# Patient Record
Sex: Male | Born: 1965 | Race: White | Hispanic: No | Marital: Married | Smoking: Never smoker
Health system: Southern US, Community
[De-identification: ages and names within clinical notes are randomized; demographics above are authoritative.]

## PROBLEM LIST (undated history)

## (undated) DIAGNOSIS — D72819 Decreased white blood cell count, unspecified: Secondary | ICD-10-CM

## (undated) DIAGNOSIS — K769 Liver disease, unspecified: Secondary | ICD-10-CM

## (undated) DIAGNOSIS — K219 Gastro-esophageal reflux disease without esophagitis: Secondary | ICD-10-CM

## (undated) DIAGNOSIS — E785 Hyperlipidemia, unspecified: Secondary | ICD-10-CM

## (undated) DIAGNOSIS — E291 Testicular hypofunction: Secondary | ICD-10-CM

## (undated) DIAGNOSIS — R911 Solitary pulmonary nodule: Secondary | ICD-10-CM

---

## 2006-04-26 ENCOUNTER — Ambulatory Visit (HOSPITAL_COMMUNITY): Admission: RE | Admit: 2006-04-26 | Discharge: 2006-04-26 | Payer: Self-pay | Admitting: *Deleted

## 2006-04-26 ENCOUNTER — Encounter (INDEPENDENT_AMBULATORY_CARE_PROVIDER_SITE_OTHER): Payer: Self-pay | Admitting: Specialist

## 2006-07-17 HISTORY — PX: UPPER GI ENDOSCOPY: SHX6162

## 2006-09-27 ENCOUNTER — Encounter: Admission: RE | Admit: 2006-09-27 | Discharge: 2006-09-27 | Payer: Self-pay | Admitting: Internal Medicine

## 2007-09-24 ENCOUNTER — Encounter: Admission: RE | Admit: 2007-09-24 | Discharge: 2007-09-24 | Payer: Self-pay | Admitting: Internal Medicine

## 2010-08-02 ENCOUNTER — Ambulatory Visit
Admission: RE | Admit: 2010-08-02 | Discharge: 2010-08-02 | Payer: Self-pay | Source: Home / Self Care | Attending: Sports Medicine | Admitting: Sports Medicine

## 2010-08-02 DIAGNOSIS — M546 Pain in thoracic spine: Secondary | ICD-10-CM | POA: Insufficient documentation

## 2010-08-02 DIAGNOSIS — M4120 Other idiopathic scoliosis, site unspecified: Secondary | ICD-10-CM | POA: Insufficient documentation

## 2010-08-03 ENCOUNTER — Encounter: Payer: Self-pay | Admitting: Sports Medicine

## 2010-08-18 NOTE — Letter (Signed)
Summary: Generic Letter  [facility]  [address]   [place], [place] [address]   Phone: [phone]  Fax: [phone]    08/03/2010 Merri Brunette, MD [facility] Fax: [phone]   Dear Dr. Renne Crigler:  I had the pleasure of evaluating your patient:  Ian [name] [address] [place], [place]  [address]  His mid thoracic back pain is associated with mild scoliosis and changes of vertebral body height at 2 levels.  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  If he continues to do a good program of strengthening and postural exercises he should be able to control symptoms from this but is at higher risk of progrssion of DDD and DJD.      Sincerely,  Vincent Gros MD

## 2010-08-18 NOTE — Assessment & Plan Note (Signed)
Summary: SCAPULA PAIN/NP/LP   Vital Signs:  Patient profile:   45 year old male Height:      71 inches Weight:      180 pounds BMI:     25.20 BP sitting:   136 / 90  Vitals Entered By: Lillia Pauls CMA (August 02, 2010 10:58 AM) CC: Back pain   CC:  Back pain.  History of Present Illness: Pain in the thoracic back for the past 6-9 months.  He has been seen by his PCP, Ortho and PT. He was diagnnosed with winged scapula and given a PT treatment plan. Since his PT program pain has improved but is still persistant. He works to improve his posture and back strength exercises.  His symptoms are middle thoracic back pain and soreness. He denies any redicular symptoms to his hands or legs. He deneis any fever, chills or night soreness.  For exercise he uses an eliptical and plays tennis about once a month.   Preventive Screening-Counseling & Management  Alcohol-Tobacco     Smoking Status: never  Current Problems (verified): 1)  Back Pain, Thoracic Region  (ICD-724.1)  Current Medications (verified): 1)  None  Allergies (verified): No Known Drug Allergies  Social History: Smoking Status:  never  Review of Systems  The patient denies anorexia, fever, and weight loss.    Physical Exam  General:  Well nad Msk:  Back: Normal appearance with good bulk of the paraspinus muscle groups and scapular muscles.    Shoulder: Normal appearance and ROM and scapular function.   Normal straight leg raise  Hips: Normal ROM and strength.  Additional Exam:  X-ray from PCP:  PA view: Mild mid thoracic scholiosis Lat View: Excessive kyphosis curvature of the thoracic back with decreased vetebral body height in curve. 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 We do not see evidenc for large bone spurs or severe bony disease.  We asked Mr Prestia to continue his home PT exercises as well as adding lawnmower, and robber exercises. We also asked him to do a reset stretch with a foam cilinder.  He will follow up as needed.   Problem # 2:  SCOLIOSIS, THORACIC SPINE (ICD-737.30) He will need to continually work with posture to keep this from worsening D/W him the need to watch sitting positoin on computer, etc  Patient Instructions: 1)  Thank you for seeing me today. 2)  Please continue your PT exercises.  3)  Add lawnmower and robbery exercises with a 5 lb weight. Goals are 3 sets of 15. You can go up to 10 lbs if you can do 5lbs with no problems.  4)  Lay on a foam cilinder (noodle) and allow your shoudlers to relax. 1-2x daily. This is the reset stretch.  5)  Let pain be your guide. Do not continue exercises that cause more than a 3/10 for pain.  6)  Posture is important at home and at work.  7)  Come back if you worsen or do not improve.  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

## 2010-12-02 NOTE — Op Note (Signed)
NAME:  Ian [name], Ian [name]             ACCOUNT NO.:  [id number]   MEDICAL RECORD NO.:  [id number]          PATIENT TYPE:  AMB   LOCATION:  ENDO                         FACILITY:  [facility]   PHYSICIAN:  Georgiana Spinner, M.D.    DATE OF BIRTH:  03-24-1966   DATE OF PROCEDURE:  04/26/2006  DATE OF DISCHARGE:                                 OPERATIVE REPORT   PROCEDURE:  Upper endoscopy with dilation and biopsy.   INDICATIONS:  Dysphagia.   ANESTHESIA:  Demerol 70 mg, Versed 8 mg.   PROCEDURE:  With the patient mildly sedated in left lateral decubitus  position the Olympus videoscopic endoscope was inserted in the mouth and  passed under vision through the esophagus which appeared normal until we  reached the distal esophagus and there was change of esophagitis  photographed. We entered the stomach. Fundus, body, antrum, duodenal bulb,  second portion duodenum were visualized. From this point the endoscope was  slowly withdrawn taking circumferential views of the duodenal mucosa until  the endoscope had been pulled back into the stomach and placed in  retroflexion to view the stomach from below. The endoscope was then  straightened and a guidewire was passed. The endoscope was withdrawn and  under fluoroscopic guidance a 17 and then 18 Savary dilators passed with  minimal resistance.  There was slight amount of heme on second dilation as  we withdrew it along with the guidewire. The endoscope was reinserted into  the stomach and biopsies of what appeared to be an area of gastritis in the  body of the stomach were noted and biopsied. The endoscope was then  withdrawn.  The patient's vital signs, pulse oximeter remained stable.  The  patient tolerated procedure well without complication.   FINDINGS:  Changes of esophagitis distally and gastritis involving the  stomach.  Await biopsy report, clinical response to dilation. The patient  will call me for results and follow-up with me as an  outpatient.           ______________________________  Georgiana Spinner, M.D.     GMO/MEDQ  D:  04/26/2006  T:  04/27/2006  Job:  [id number]

## 2012-08-20 ENCOUNTER — Other Ambulatory Visit: Payer: Self-pay | Admitting: Registered Nurse

## 2016-10-06 DIAGNOSIS — Z125 Encounter for screening for malignant neoplasm of prostate: Secondary | ICD-10-CM | POA: Diagnosis not present

## 2016-10-06 DIAGNOSIS — Z0000 Encounter for general adult medical examination without abnormal findings: Secondary | ICD-10-CM | POA: Diagnosis not present

## 2016-10-10 DIAGNOSIS — D72819 Decreased white blood cell count, unspecified: Secondary | ICD-10-CM | POA: Diagnosis not present

## 2016-10-10 DIAGNOSIS — Z0000 Encounter for general adult medical examination without abnormal findings: Secondary | ICD-10-CM | POA: Diagnosis not present

## 2016-10-10 DIAGNOSIS — Z1212 Encounter for screening for malignant neoplasm of rectum: Secondary | ICD-10-CM | POA: Diagnosis not present

## 2016-10-10 DIAGNOSIS — E782 Mixed hyperlipidemia: Secondary | ICD-10-CM | POA: Diagnosis not present

## 2016-10-11 DIAGNOSIS — D72819 Decreased white blood cell count, unspecified: Secondary | ICD-10-CM | POA: Diagnosis not present

## 2017-10-15 DIAGNOSIS — Z0000 Encounter for general adult medical examination without abnormal findings: Secondary | ICD-10-CM | POA: Diagnosis not present

## 2017-10-15 DIAGNOSIS — D72819 Decreased white blood cell count, unspecified: Secondary | ICD-10-CM | POA: Diagnosis not present

## 2017-10-19 DIAGNOSIS — Z0000 Encounter for general adult medical examination without abnormal findings: Secondary | ICD-10-CM | POA: Diagnosis not present

## 2017-10-19 DIAGNOSIS — E782 Mixed hyperlipidemia: Secondary | ICD-10-CM | POA: Diagnosis not present

## 2018-04-16 DIAGNOSIS — Z1212 Encounter for screening for malignant neoplasm of rectum: Secondary | ICD-10-CM | POA: Diagnosis not present

## 2018-04-16 DIAGNOSIS — Z1211 Encounter for screening for malignant neoplasm of colon: Secondary | ICD-10-CM | POA: Diagnosis not present

## 2018-09-10 DIAGNOSIS — J019 Acute sinusitis, unspecified: Secondary | ICD-10-CM | POA: Diagnosis not present

## 2018-11-04 DIAGNOSIS — Z125 Encounter for screening for malignant neoplasm of prostate: Secondary | ICD-10-CM | POA: Diagnosis not present

## 2018-11-04 DIAGNOSIS — Z1159 Encounter for screening for other viral diseases: Secondary | ICD-10-CM | POA: Diagnosis not present

## 2018-11-04 DIAGNOSIS — Z0000 Encounter for general adult medical examination without abnormal findings: Secondary | ICD-10-CM | POA: Diagnosis not present

## 2018-11-07 DIAGNOSIS — E782 Mixed hyperlipidemia: Secondary | ICD-10-CM | POA: Diagnosis not present

## 2018-11-07 DIAGNOSIS — Z1212 Encounter for screening for malignant neoplasm of rectum: Secondary | ICD-10-CM | POA: Diagnosis not present

## 2018-11-07 DIAGNOSIS — Z23 Encounter for immunization: Secondary | ICD-10-CM | POA: Diagnosis not present

## 2018-11-07 DIAGNOSIS — K219 Gastro-esophageal reflux disease without esophagitis: Secondary | ICD-10-CM | POA: Diagnosis not present

## 2018-11-07 DIAGNOSIS — J309 Allergic rhinitis, unspecified: Secondary | ICD-10-CM | POA: Diagnosis not present

## 2018-11-07 DIAGNOSIS — Z0000 Encounter for general adult medical examination without abnormal findings: Secondary | ICD-10-CM | POA: Diagnosis not present

## 2020-11-26 ENCOUNTER — Other Ambulatory Visit: Payer: Self-pay | Admitting: Internal Medicine

## 2020-11-26 DIAGNOSIS — E782 Mixed hyperlipidemia: Secondary | ICD-10-CM

## 2020-12-29 ENCOUNTER — Ambulatory Visit
Admission: RE | Admit: 2020-12-29 | Discharge: 2020-12-29 | Disposition: A | Discharge status: Home / Self Care | Payer: No Typology Code available for payment source | Source: Ambulatory Visit | Attending: Internal Medicine | Admitting: Internal Medicine

## 2020-12-29 DIAGNOSIS — E782 Mixed hyperlipidemia: Secondary | ICD-10-CM

## 2021-05-10 LAB — COLOGUARD: COLOGUARD: NEGATIVE

## 2021-06-06 ENCOUNTER — Other Ambulatory Visit: Payer: Self-pay | Admitting: Internal Medicine

## 2021-06-06 DIAGNOSIS — R918 Other nonspecific abnormal finding of lung field: Secondary | ICD-10-CM

## 2021-07-22 ENCOUNTER — Other Ambulatory Visit: Payer: No Typology Code available for payment source

## 2021-08-15 ENCOUNTER — Ambulatory Visit
Admission: RE | Admit: 2021-08-15 | Discharge: 2021-08-15 | Disposition: A | Discharge status: Home / Self Care | Payer: 59 | Source: Ambulatory Visit | Attending: Internal Medicine | Admitting: Internal Medicine

## 2021-08-15 ENCOUNTER — Other Ambulatory Visit: Payer: No Typology Code available for payment source

## 2021-08-15 ENCOUNTER — Other Ambulatory Visit: Payer: Self-pay

## 2021-08-15 DIAGNOSIS — R918 Other nonspecific abnormal finding of lung field: Secondary | ICD-10-CM

## 2021-08-30 ENCOUNTER — Other Ambulatory Visit: Payer: Self-pay | Admitting: Internal Medicine

## 2021-08-30 DIAGNOSIS — K769 Liver disease, unspecified: Secondary | ICD-10-CM

## 2021-09-24 ENCOUNTER — Ambulatory Visit
Admission: RE | Admit: 2021-09-24 | Discharge: 2021-09-24 | Disposition: A | Discharge status: Home / Self Care | Payer: 59 | Source: Ambulatory Visit | Attending: Internal Medicine | Admitting: Internal Medicine

## 2021-09-24 ENCOUNTER — Other Ambulatory Visit: Payer: Self-pay

## 2021-09-24 DIAGNOSIS — K769 Liver disease, unspecified: Secondary | ICD-10-CM

## 2021-09-24 MED ORDER — GADOBENATE DIMEGLUMINE 529 MG/ML IV SOLN
17.0000 mL | Freq: Once | INTRAVENOUS | Status: AC | PRN
  Administered 2021-09-24: 17 mL via INTRAVENOUS

## 2021-09-29 ENCOUNTER — Telehealth: Payer: Self-pay | Admitting: Oncology

## 2021-09-29 NOTE — Telephone Encounter (Signed)
[facility] Scheduling Phone Note ? ?I contacted Ian [name] regarding a referral from Olena Heckle with [facility] for purpose of evaluation and work up of left kidney mass.  Ian [name] was aware of his referral and reason.  Information on reason for referral was not necessary. ? ?Patient was informed about the [facility] and the intent being to complete a diagnostic/prognostic work-up for his suspicious findings.  He is aware his appointment is with our Physician Assistant to identify a diagnostic plan of care and to arrange further oncologist or specialist care as indicated. ? ?I confirmed with Ian [name] he  has transportation to his [facility] appointment.  Patient is aware to bring a list of medications, as well as insurance cards.  Visitor policy and COVID protocol reviewed with patient as well, including what to expect on arrival to the [facility] regarding check-in process, visit, and potential for labs afterwards. ? ?We look forward to meeting Ian [name] and completing his diagnostic work-up and arranging his subsequent appointment with our oncologist team. ? ?Diagnostic Clinic Specific Info: ? ?Best contact/way to contact patient:  mobile ?CHL updated to reflect?:  not applicable ?Is there a HC POA?:  No ? ?Location of Diagnostic Clinic Appointment and Contact Info Provided to Patient: ? ?[facility] ?[address] ?[place], [place] [address] ?[phone] ? ? ?Reason for Referral, Clinical Information: ? ?09/24/2021 MR ABDOMEN W WO CONTRAST ?IMPRESSION: ?1. There is a heterogeneously enhancing mass of the posterior lip of ?the midportion of the left kidney measuring 3.5 x 3.0 cm. There ?appears to be at least some subtle involvement of the adjacent left ?renal vein. Findings are highly concerning for renal cell carcinoma. ?2. No evidence of lymphadenopathy or metastatic disease in  the ?abdomen. ?3. Definitively benign hemangiomata of the left and right lobes of ?the liver. ? ?08/15/2021 CT CHEST WO CONTRAST ?IMPRESSION: ?1. Stable size and appearance of 3-4 mm sub solid nodular density in ?the perifissural right middle lobe. The previous perifissural left ?lower lobe nodule on prior exam has diminished, likely an ?intrapulmonary lymph node. This exam constitutes 6 months imaging ?stability. Recommended follow-up for a stable sub solid nodule of ?this size is as follows: Consider additional non-contrast chest CT ?examinations at 2 and 4 years. This recommendation follows the ?consensus statement: Guidelines for Management of Incidental ?Pulmonary Nodules Detected on CT Images: From the Fleischner Society ?2017; Radiology 2017; [id number]. ?2. Tiny 2 mm subpleural nodule in the left lung apex, not previously ?included in the field of view. This can be followed on subsequent ?exams. ?3. Indeterminate 3.4 cm low-density liver lesion in the lower right ?hepatic lobe. No prior abdominal imaging to establish stability. ?Recommend further characterization with hepatic protocol MRI.  ?

## 2021-10-12 ENCOUNTER — Other Ambulatory Visit: Payer: Self-pay

## 2021-10-12 ENCOUNTER — Inpatient Hospital Stay: Payer: 59 | Attending: Physician Assistant | Admitting: Physician Assistant

## 2021-10-12 ENCOUNTER — Encounter: Payer: Self-pay | Admitting: Physician Assistant

## 2021-10-12 ENCOUNTER — Inpatient Hospital Stay: Payer: 59

## 2021-10-12 VITALS — BP 129/86 | HR 68 | Temp 97.7°F | Resp 20 | Wt 191.4 lb

## 2021-10-12 DIAGNOSIS — N2889 Other specified disorders of kidney and ureter: Secondary | ICD-10-CM | POA: Diagnosis present

## 2021-10-12 DIAGNOSIS — R918 Other nonspecific abnormal finding of lung field: Secondary | ICD-10-CM | POA: Insufficient documentation

## 2021-10-12 LAB — CBC WITH DIFFERENTIAL (CANCER CENTER ONLY)
Abs Immature Granulocytes: 0 10*3/uL (ref 0.00–0.07)
Basophils Absolute: 0.1 10*3/uL (ref 0.0–0.1)
Basophils Relative: 1 %
Eosinophils Absolute: 0.1 10*3/uL (ref 0.0–0.5)
Eosinophils Relative: 3 %
HCT: 36.3 % — ABNORMAL LOW (ref 39.0–52.0)
Hemoglobin: 12.4 g/dL — ABNORMAL LOW (ref 13.0–17.0)
Immature Granulocytes: 0 %
Lymphocytes Relative: 42 %
Lymphs Abs: 2 10*3/uL (ref 0.7–4.0)
MCH: 33.2 pg (ref 26.0–34.0)
MCHC: 34.2 g/dL (ref 30.0–36.0)
MCV: 97.1 fL (ref 80.0–100.0)
Monocytes Absolute: 0.6 10*3/uL (ref 0.1–1.0)
Monocytes Relative: 12 %
Neutro Abs: 2 10*3/uL (ref 1.7–7.7)
Neutrophils Relative %: 42 %
Platelet Count: 247 10*3/uL (ref 150–400)
RBC: 3.74 MIL/uL — ABNORMAL LOW (ref 4.22–5.81)
RDW: 11.7 % (ref 11.5–15.5)
WBC Count: 4.8 10*3/uL (ref 4.0–10.5)
nRBC: 0 % (ref 0.0–0.2)

## 2021-10-12 LAB — CMP (CANCER CENTER ONLY)
ALT: 25 U/L (ref 0–44)
AST: 23 U/L (ref 15–41)
Albumin: 4.4 g/dL (ref 3.5–5.0)
Alkaline Phosphatase: 74 U/L (ref 38–126)
Anion gap: 6 (ref 5–15)
BUN: 21 mg/dL — ABNORMAL HIGH (ref 6–20)
CO2: 30 mmol/L (ref 22–32)
Calcium: 9.6 mg/dL (ref 8.9–10.3)
Chloride: 103 mmol/L (ref 98–111)
Creatinine: 0.9 mg/dL (ref 0.61–1.24)
GFR, Estimated: >60 mL/min (ref >60)
Glucose, Bld: 82 mg/dL (ref 70–99)
Potassium: 4.2 mmol/L (ref 3.5–5.1)
Sodium: 139 mmol/L (ref 135–145)
Total Bilirubin: 0.7 mg/dL (ref 0.3–1.2)
Total Protein: 7.5 g/dL (ref 6.5–8.1)

## 2021-10-12 NOTE — Progress Notes (Signed)
?[facility] ?[place] ?[phone]   [phone] ? ?INITIAL CONSULTATION: ? ?Patient Care Team: ?Deland Pretty, MD as PCP - General (Internal Medicine) ? ?CHIEF COMPLAINTS/PURPOSE OF CONSULTATION:  ?Left renal mass, subcentimeter lung nodules ? ?HISTORY OF PRESENTING ILLNESS:  ?Ian [name] 56 y.o. male without any medical history presents to the diagnostic clinic for evaluation for left renal mass and subcentimeter lung nodules. ? ?On review of the previous records, Ian [name] underwent routine CT cardiac calcium screening on 12/29/2020.  Findings revealed subtle, small nodular densities in the lungs that were indeterminate.  Repeat CT chest was obtained on 08/15/2021 that showed stable to improving size and appearance of the subcentimeter lung nodules.  There was an incidental finding of an indeterminate 3.4 cm low-density liver lesion in the lower right hepatic lobe.  An MRI of the abdomen was obtained on 09/24/2021 that showed benign fluid-filled hemangiomas involving left and right lobes of the liver.  Additionally there was evidence of heterogeneously enhancing mass of left kidney measuring 3.5 x 3.0 cm with some subtle involvement of the adjacent left renal vein.  No evidence of lymphadenopathy or metastatic disease in the abdomen. ? ?At today's visit, Ian [name] reports having stable energy levels and able to complete all his daily activities on his own. He denies any weight changes or appetite loss. He denies any abdominal pain or back pain. He denies any urinary symptoms including hematuria or dysuria. Patient denies fevers, chills, night sweats, shortness of breath, chest, nausea, vomiting, diarrhea. He has no other complaints. Rest of the 10 point ROS is below.  ? ?MEDICAL HISTORY:  ?History reviewed. No pertinent past medical history. ? ?SURGICAL HISTORY: ?History reviewed. No pertinent surgical history. ? ?SOCIAL HISTORY: ?Social History   ? ?Socioeconomic History  ? Marital status: Married  ?  Spouse name: Not on file  ? Number of children: Not on file  ? Years of education: Not on file  ? Highest education level: Not on file  ?Occupational History  ? Not on file  ?Tobacco Use  ? Smoking status: Never  ? Smokeless tobacco: Never  ?Substance and Sexual Activity  ? Alcohol use: Yes  ?  Alcohol/week: 14.0 standard drinks  ?  Types: 14 Standard drinks or equivalent per week  ? Drug use: Never  ? Sexual activity: Not on file  ?Other Topics Concern  ? Not on file  ?Social History Narrative  ? Not on file  ? ?Social Determinants of Health  ? ?Financial Resource Strain: Not on file  ?Food Insecurity: Not on file  ?Transportation Needs: Not on file  ?Physical Activity: Not on file  ?Stress: Not on file  ?Social Connections: Not on file  ?Intimate Partner Violence: Not on file  ? ? ?FAMILY HISTORY: ?Family History  ?Problem Relation Age of Onset  ? Melanoma Mother   ? Non-Hodgkin's lymphoma Father   ? ? ?ALLERGIES:  has no allergies on file. ? ?MEDICATIONS:  ?Current Outpatient Medications  ?Medication Sig Dispense Refill  ? Cyanocobalamin (B-12 SL) Place under the tongue.    ? fexofenadine (ALLEGRA) 60 MG tablet Take 60 mg by mouth as needed for allergies or rhinitis.    ? glucosamine-chondroitin 500-400 MG tablet Take 2 tablets by mouth daily.    ? Multiple Vitamins-Minerals (MENS MULTIVITAMIN PO) Take by mouth.    ? Omega-3 Fatty Acids (FISH OIL PO) Take by mouth daily.    ? omeprazole (PRILOSEC) 20 MG capsule Take 20 mg by  mouth daily.    ? tadalafil (CIALIS) 20 MG tablet Take 20 mg by mouth daily as needed for erectile dysfunction.    ? ?No current facility-administered medications for this visit.  ? ? ?REVIEW OF SYSTEMS:   ?Constitutional: ( - ) fevers, ( - )  chills , ( - ) night sweats ?Eyes: ( - ) blurriness of vision, ( - ) double vision, ( - ) watery eyes ?Ears, nose, mouth, throat, and face: ( - ) mucositis, ( - ) sore throat ?Respiratory: ( - )  cough, ( - ) dyspnea, ( - ) wheezes ?Cardiovascular: ( - ) palpitation, ( - ) chest discomfort, ( - ) lower extremity swelling ?Gastrointestinal:  ( - ) nausea, ( - ) heartburn, ( - ) change in bowel habits ?Skin: ( - ) abnormal skin rashes ?Lymphatics: ( - ) new lymphadenopathy, ( - ) easy bruising ?Neurological: ( - ) numbness, ( - ) tingling, ( - ) new weaknesses ?Behavioral/Psych: ( - ) mood change, ( - ) new changes  ?All other systems were reviewed with the patient and are negative. ? ?PHYSICAL EXAMINATION: ?ECOG PERFORMANCE STATUS: 0 - Asymptomatic ? ?Vitals:  ? 10/12/21 1308  ?BP: 129/86  ?Pulse: 68  ?Resp: 20  ?Temp: 97.7 ?F (36.5 ?C)  ?SpO2: 100%  ? ?Filed Weights  ? 10/12/21 1308  ?Weight: 191 lb 6.4 oz (86.8 kg)  ? ? ?GENERAL: well appearing male in NAD  ?SKIN: skin color, texture, turgor are normal, no rashes or significant lesions ?EYES: conjunctiva are pink and non-injected, sclera clear ?OROPHARYNX: no exudate, no erythema; lips, buccal mucosa, and tongue normal  ?NECK: supple, non-tender ?LYMPH:  no palpable lymphadenopathy in the cervical, axillary or supraclavicular lymph nodes.  ?LUNGS: clear to auscultation and percussion with normal breathing effort ?HEART: regular rate & rhythm and no murmurs and no lower extremity edema ?ABDOMEN: soft, non-tender, non-distended, normal bowel sounds ?Musculoskeletal: no cyanosis of digits and no clubbing  ?PSYCH: alert & oriented x 3, fluent speech ?NEURO: no focal motor/sensory deficits ? ?LABORATORY DATA:  ?I have reviewed the data as listed ? ?  Latest Ref Rng & Units 10/12/2021  ?  2:22 PM  ?CBC  ?WBC 4.0 - 10.5 K/uL 4.8    ?Hemoglobin 13.0 - 17.0 g/dL 12.4    ?Hematocrit 39.0 - 52.0 % 36.3    ?Platelets 150 - 400 K/uL 247    ? ? ? ?  Latest Ref Rng & Units 10/12/2021  ?  2:22 PM  ?CMP  ?Glucose 70 - 99 mg/dL 82    ?BUN 6 - 20 mg/dL 21    ?Creatinine 0.61 - 1.24 mg/dL 0.90    ?Sodium 135 - 145 mmol/L 139    ?Potassium 3.5 - 5.1 mmol/L 4.2    ?Chloride 98 -  111 mmol/L 103    ?CO2 22 - 32 mmol/L 30    ?Calcium 8.9 - 10.3 mg/dL 9.6    ?Total Protein 6.5 - 8.1 g/dL 7.5    ?Total Bilirubin 0.3 - 1.2 mg/dL 0.7    ?Alkaline Phos 38 - 126 U/L 74    ?AST 15 - 41 U/L 23    ?ALT 0 - 44 U/L 25    ? ? ? ?RADIOGRAPHIC STUDIES: ?I have personally reviewed the radiological images as listed and agreed with the findings in the report. ?MR ABDOMEN WWO CONTRAST ? ?Result Date: 09/24/2021 ?CLINICAL DATA:  Characterize liver lesion incidentally identified by CT EXAM: MRI ABDOMEN WITHOUT AND  WITH CONTRAST TECHNIQUE: Multiplanar multisequence MR imaging of the abdomen was performed both before and after the administration of intravenous contrast. CONTRAST:  90m MULTIHANCE GADOBENATE DIMEGLUMINE 529 MG/ML IV SOLN COMPARISON:  CT chest, 08/15/2021 FINDINGS: Lower chest: No acute findings. Hepatobiliary: Definitively benign, fluid signal hemangiomata of the left and right lobes of the liver, demonstrating discontinuous progressive peripheral contrast enhancement, in the posterior left lobe of the liver, hepatic segment II, measuring 3.8 x 3.5 cm (series 15, image 24), in the right lobe of the liver, hepatic segment V, measuring 3.3 x 2.6 cm (series 15, image 44). Additional simple subcentimeter cyst of the posterior liver dome (series 4, image 8). No solid mass or other parenchymal abnormality identified. No gallstones. No biliary ductal dilatation. Pancreas: No mass, inflammatory changes, or other parenchymal abnormality identified.No pancreatic ductal dilatation. Spleen:  Within normal limits in size and appearance. Adrenals/Urinary Tract: Normal adrenal glands. There is a heterogeneously enhancing mass of the posterior lip of the midportion of the left kidney measuring 3.5 x 3.0 cm (series 11, image 62). There appears to be at least some subtle involvement of the adjacent left renal vein (series 13, image 60, series 17, image 33). No evidence of hydronephrosis. Stomach/Bowel: Visualized  portions within the abdomen are unremarkable. Vascular/Lymphatic: No pathologically enlarged lymph nodes identified. No abdominal aortic aneurysm demonstrated. Other:  None. Musculoskeletal: No suspicious osseous lesio

## 2021-10-13 DIAGNOSIS — R918 Other nonspecific abnormal finding of lung field: Secondary | ICD-10-CM | POA: Insufficient documentation

## 2021-10-13 DIAGNOSIS — N2889 Other specified disorders of kidney and ureter: Secondary | ICD-10-CM | POA: Insufficient documentation

## 2021-10-19 ENCOUNTER — Telehealth: Payer: Self-pay

## 2021-10-19 NOTE — Telephone Encounter (Signed)
Mr Capizzi has been referred to [facility].  His appt is 10/25/21 at 8:15.  Pt has been advised of date and time. ?

## 2021-11-01 ENCOUNTER — Other Ambulatory Visit: Payer: Self-pay | Admitting: Urology

## 2021-11-02 ENCOUNTER — Ambulatory Visit: Payer: 59 | Admitting: Physician Assistant

## 2021-11-07 ENCOUNTER — Other Ambulatory Visit: Payer: Self-pay | Admitting: Urology

## 2021-11-07 DIAGNOSIS — D49512 Neoplasm of unspecified behavior of left kidney: Secondary | ICD-10-CM

## 2021-11-24 ENCOUNTER — Ambulatory Visit
Admission: RE | Admit: 2021-11-24 | Discharge: 2021-11-24 | Disposition: A | Discharge status: Home / Self Care | Payer: 59 | Source: Ambulatory Visit | Attending: Urology | Admitting: Urology

## 2021-11-24 DIAGNOSIS — D49512 Neoplasm of unspecified behavior of left kidney: Secondary | ICD-10-CM

## 2021-11-24 MED ORDER — GADOBENATE DIMEGLUMINE 529 MG/ML IV SOLN
17.0000 mL | Freq: Once | INTRAVENOUS | Status: AC | PRN
  Administered 2021-11-24: 17 mL via INTRAVENOUS

## 2021-11-28 ENCOUNTER — Encounter (HOSPITAL_COMMUNITY): Payer: Self-pay

## 2021-11-28 NOTE — Patient Instructions (Signed)
DUE TO COVID-19 ONLY TWO VISITORS  (aged 54 and older)  ARE ALLOWED TO COME WITH YOU AND STAY IN THE WAITING ROOM ONLY DURING PRE OP AND PROCEDURE.  ?  ?**NO VISITORS ARE ALLOWED IN THE SHORT STAY AREA OR RECOVERY ROOM!!** ? ?IF YOU WILL BE ADMITTED INTO THE HOSPITAL YOU ARE ALLOWED ONLY FOUR SUPPORT PEOPLE DURING VISITATION HOURS ONLY ? (7 AM -8PM)   ?The support person(s) must pass our screening, gel in and out, and wear a mask at all times, including in the patient?s room. ?Patients must also wear a mask when staff or their support person are in the room. ?Visitors GUEST BADGE MUST BE WORN VISIBLY  ?One adult visitor may remain with you overnight and MUST be in the room by 8 P.M. ?  ? ? Your procedure is scheduled on: 12/16/21 ? ? Report to [facility] Main Entrance ? ?  Report to admitting at 9:15 AM ? ? Call this number if you have problems the morning of surgery [phone] ? ? Do not eat food :After Midnight.on 12/15/21. Change to a clear liquid diet until midnight  day of surgery then Nothing by mouth until surgery ? ? ?Water ?Black Coffee (sugar ok, NO MILK/CREAM OR CREAMERS)  ?Tea (sugar ok, NO MILK/CREAM OR CREAMERS) regular and decaf                             ?Plain Jell-O (NO RED)                                           ?Fruit ices (not with fruit pulp, NO RED)                                     ?Popsicles (NO RED)                                                                  ?Juice: apple, WHITE grape, WHITE cranberry ?Sports drinks like Gatorade (NO RED) ?Clear broth(vegetable,chicken,beef) ? ?             ? ?              If you have questions, please contact your surgeon?s office. ? ? ?FOLLOW BOWEL PREP AND ANY ADDITIONAL PRE OP INSTRUCTIONS YOU RECEIVED FROM YOUR SURGEON'S OFFICE!!! ?  ?  ?Oral Hygiene is also important to reduce your risk of infection.                                    ?Remember - BRUSH YOUR TEETH THE MORNING OF SURGERY WITH YOUR REGULAR TOOTHPASTE ? ? Do NOT smoke  after Midnight ? ? Take these medicines the morning of surgery with A SIP OF WATER: Omeprazole ? ?DO NOT TAKE ANY ORAL DIABETIC MEDICATIONS DAY OF YOUR SURGERY ? ?Bring CPAP mask and tubing day of surgery. ?                  ?  You may not have any metal on your body including  jewelry, and body piercing ? ?           Do not wear  lotions, powders, cologne, or deodorant ? ? ?            Men may shave face and neck. ? ? Do not bring valuables to the hospital. [facility] NOT ?            RESPONSIBLE   FOR VALUABLES. ? ? Contacts, dentures or bridgework may not be worn into surgery. ? ? Bring small overnight bag day of surgery. ?  ? ? Special Instructions: Bring a copy of your healthcare power of attorney and living will documents  the day of surgery if you haven't scanned them before. ? ?            Please read over the following fact sheets you were given: IF [place] [phone] ? ?   [facility] - Preparing for Surgery ?Before surgery, you can play an important role.  Because skin is not sterile, your skin needs to be as free of germs as possible.  You can reduce the number of germs on your skin by washing with CHG (chlorahexidine gluconate) soap before surgery.  CHG is an antiseptic cleaner which kills germs and bonds with the skin to continue killing germs even after washing. ?Please DO NOT use if you have an allergy to CHG or antibacterial soaps.  If your skin becomes reddened/irritated stop using the CHG and inform your nurse when you arrive at Short Stay. You may shave your face/neck. ?Please follow these instructions carefully: ? 1.  Shower with CHG Soap the night before surgery and the  morning of Surgery. ? 2.  If you choose to wash your hair, wash your hair first as usual with your  normal  shampoo. ? 3.  After you shampoo, rinse your hair and body thoroughly to remove the  shampoo.                       ?     4.  Use CHG as you would any  other liquid soap.  You can apply chg directly  to the skin and wash  ?                     Gently with a scrungie or clean washcloth. ? 5.  Apply the CHG Soap to your body ONLY FROM THE NECK DOWN.   Do not use on face/ open      ?                     Wound or open sores. Avoid contact with eyes, ears mouth and genitals (private parts).  ?                     Production manager,  Genitals (private parts) with your normal soap. ?            6.  Wash thoroughly, paying special attention to the area where your surgery  will be performed. ? 7.  Thoroughly rinse your body with warm water from the neck down. ? 8.  DO NOT shower/wash with your normal soap after using and rinsing off  the CHG Soap. ?               9.  Pat yourself dry with a clean towel. ?           10.  Wear clean pajamas. ?           11.  Place clean sheets on your bed the night of your first shower and do not  sleep with pets. ?Day of Surgery : ?Do not apply any lotions/deodorants the morning of surgery.  Please wear clean clothes to the hospital/surgery center. ? ?FAILURE TO FOLLOW THESE INSTRUCTIONS MAY RESULT IN THE CANCELLATION OF YOUR SURGERY ? ? ? ?________________________________________________________________________  ? ?Incentive Spirometer ? ?An incentive spirometer is a tool that can help keep your lungs clear and active. This tool measures how well you are filling your lungs with each breath. Taking long deep breaths may help reverse or decrease the chance of developing breathing (pulmonary) problems (especially infection) following: ?A long period of time when you are unable to move or be active. ?BEFORE THE PROCEDURE  ?If the spirometer includes an indicator to show your best effort, your nurse or respiratory therapist will set it to a desired goal. ?If possible, sit up straight or lean slightly forward. Try not to slouch. ?Hold the incentive spirometer in an upright position. ?INSTRUCTIONS FOR USE  ?Sit on the edge of your bed if possible, or sit  up as far as you can in bed or on a chair. ?Hold the incentive spirometer in an upright position. ?Breathe out normally. ?Place the mouthpiece in your mouth and seal your lips tightly around it. ?Breathe in slowly and as deeply as possible, raising the piston or the ball toward the top of the column. ?Hold your breath for 3-5 seconds or for as long as possible. Allow the piston or ball to fall to the bottom of the column. ?Remove the mouthpiece from your mouth and breathe out normally. ?Rest for a few seconds and repeat Steps 1 through 7 at least 10 times every 1-2 hours when you are awake. Take your time and take a few normal breaths between deep breaths. ?The spirometer may include an indicator to show your best effort. Use the indicator as a goal to work toward during each repetition. ?After each set of 10 deep breaths, practice coughing to be sure your lungs are clear. If you have an incision (the cut made at the time of surgery), support your incision when coughing by placing a pillow or rolled up towels firmly against it. ?Once you are able to get out of bed, walk around indoors and cough well. You may stop using the incentive spirometer when instructed by your caregiver.  ?RISKS AND COMPLICATIONS ?Take your time so you do not get dizzy or light-headed. ?If you are in pain, you may need to take or ask for pain medication before doing incentive spirometry. It is harder to take a deep breath if you are having pain. ?AFTER USE ?Rest and breathe slowly and easily. ?It can be helpful to keep track of a log of your progress. Your caregiver can provide you with a simple table to help with this. ?If you are using the spirometer at home, follow these instructions: ?SEEK MEDICAL CARE IF:  ?You are having difficultly using the spirometer. ?You have trouble using the spirometer as often as instructed. ?Your pain medication is not giving enough relief while using the spirometer. ?You develop fever of 100.5? F (38.1? C) or  higher. ?SEEK IMMEDIATE MEDICAL CARE IF:  ?You cough up bloody sputum that had not been present before. ?You  develop fever of 102? F (38.9? C) or greater. ?You develop worsening pain at or near the inc

## 2021-12-02 ENCOUNTER — Encounter (HOSPITAL_COMMUNITY)
Admission: RE | Admit: 2021-12-02 | Discharge: 2021-12-02 | Disposition: A | Discharge status: Home / Self Care | Payer: 59 | Source: Ambulatory Visit | Attending: Urology | Admitting: Urology

## 2021-12-02 ENCOUNTER — Other Ambulatory Visit: Payer: Self-pay

## 2021-12-02 ENCOUNTER — Encounter (HOSPITAL_COMMUNITY): Payer: Self-pay

## 2021-12-02 VITALS — HR 58 | Temp 97.9°F | Resp 18 | Ht 71.0 in | Wt 189.0 lb

## 2021-12-02 DIAGNOSIS — N2889 Other specified disorders of kidney and ureter: Secondary | ICD-10-CM | POA: Diagnosis not present

## 2021-12-02 DIAGNOSIS — Z01812 Encounter for preprocedural laboratory examination: Secondary | ICD-10-CM | POA: Diagnosis present

## 2021-12-02 DIAGNOSIS — Z01818 Encounter for other preprocedural examination: Secondary | ICD-10-CM

## 2021-12-02 HISTORY — DX: Gastro-esophageal reflux disease without esophagitis: K21.9

## 2021-12-02 HISTORY — DX: Testicular hypofunction: E29.1

## 2021-12-02 HISTORY — DX: Liver disease, unspecified: K76.9

## 2021-12-02 HISTORY — DX: Solitary pulmonary nodule: R91.1

## 2021-12-02 HISTORY — DX: Hyperlipidemia, unspecified: E78.5

## 2021-12-02 HISTORY — DX: Decreased white blood cell count, unspecified: D72.819

## 2021-12-02 LAB — BASIC METABOLIC PANEL
Anion gap: 7 (ref 5–15)
BUN: 24 mg/dL — ABNORMAL HIGH (ref 6–20)
CO2: 28 mmol/L (ref 22–32)
Calcium: 9.8 mg/dL (ref 8.9–10.3)
Chloride: 103 mmol/L (ref 98–111)
Creatinine, Ser: 1.63 mg/dL — ABNORMAL HIGH (ref 0.61–1.24)
GFR, Estimated: 50 mL/min — ABNORMAL LOW (ref >60)
Glucose, Bld: 90 mg/dL (ref 70–99)
Potassium: 4.4 mmol/L (ref 3.5–5.1)
Sodium: 138 mmol/L (ref 135–145)

## 2021-12-02 LAB — CBC
HCT: 38.5 % — ABNORMAL LOW (ref 39.0–52.0)
Hemoglobin: 13.1 g/dL (ref 13.0–17.0)
MCH: 33.2 pg (ref 26.0–34.0)
MCHC: 34 g/dL (ref 30.0–36.0)
MCV: 97.7 fL (ref 80.0–100.0)
Platelets: 270 10*3/uL (ref 150–400)
RBC: 3.94 MIL/uL — ABNORMAL LOW (ref 4.22–5.81)
RDW: 12 % (ref 11.5–15.5)
WBC: 4.5 10*3/uL (ref 4.0–10.5)
nRBC: 0 % (ref 0.0–0.2)

## 2021-12-02 NOTE — Progress Notes (Signed)
Anesthesia note:  Bowel prep reminder:NA  PCP - Dr. Teressa Lower Cardiologist -none Other-   Chest x-ray - no EKG - no Stress Test - no ECHO - no Cardiac Cath - NA  Pacemaker/ICD device last checked:NA  Sleep Study - no CPAP -   Pt is pre diabetic-NA Fasting Blood Sugar -  Checks Blood Sugar _____  Blood Thinner:NA Blood Thinner Instructions: Aspirin Instructions: Last Dose:  Anesthesia review: no  Patient denies shortness of breath, fever, cough and chest pain at PAT appointment Pt has no SOB with any activities.   Patient verbalized understanding of instructions that were given to them at the PAT appointment. Patient was also instructed that they will need to review over the PAT instructions again at home before surgery. yes

## 2021-12-16 ENCOUNTER — Encounter (HOSPITAL_COMMUNITY)
Admission: RE | Disposition: A | Discharge status: Home / Self Care | Payer: Self-pay | Source: Home / Self Care | Attending: Urology

## 2021-12-16 ENCOUNTER — Other Ambulatory Visit: Payer: Self-pay

## 2021-12-16 ENCOUNTER — Inpatient Hospital Stay (HOSPITAL_COMMUNITY): Payer: 59 | Admitting: Certified Registered"

## 2021-12-16 ENCOUNTER — Encounter (HOSPITAL_COMMUNITY): Payer: Self-pay | Admitting: Urology

## 2021-12-16 ENCOUNTER — Inpatient Hospital Stay (HOSPITAL_COMMUNITY)
Admission: RE | Admit: 2021-12-16 | Discharge: 2021-12-17 | DRG: 657 | Disposition: A | Discharge status: Home / Self Care | Payer: 59 | Attending: Urology | Admitting: Urology

## 2021-12-16 DIAGNOSIS — K219 Gastro-esophageal reflux disease without esophagitis: Secondary | ICD-10-CM | POA: Diagnosis present

## 2021-12-16 DIAGNOSIS — Z888 Allergy status to other drugs, medicaments and biological substances status: Secondary | ICD-10-CM

## 2021-12-16 DIAGNOSIS — N529 Male erectile dysfunction, unspecified: Secondary | ICD-10-CM | POA: Diagnosis present

## 2021-12-16 DIAGNOSIS — Z808 Family history of malignant neoplasm of other organs or systems: Secondary | ICD-10-CM

## 2021-12-16 DIAGNOSIS — N2889 Other specified disorders of kidney and ureter: Secondary | ICD-10-CM

## 2021-12-16 DIAGNOSIS — Z01818 Encounter for other preprocedural examination: Principal | ICD-10-CM

## 2021-12-16 DIAGNOSIS — I823 Embolism and thrombosis of renal vein: Secondary | ICD-10-CM | POA: Diagnosis present

## 2021-12-16 DIAGNOSIS — Z8249 Family history of ischemic heart disease and other diseases of the circulatory system: Secondary | ICD-10-CM

## 2021-12-16 DIAGNOSIS — E785 Hyperlipidemia, unspecified: Secondary | ICD-10-CM | POA: Diagnosis present

## 2021-12-16 DIAGNOSIS — Z807 Family history of other malignant neoplasms of lymphoid, hematopoietic and related tissues: Secondary | ICD-10-CM

## 2021-12-16 DIAGNOSIS — C642 Malignant neoplasm of left kidney, except renal pelvis: Secondary | ICD-10-CM | POA: Diagnosis present

## 2021-12-16 HISTORY — PX: ROBOT ASSISTED LAPAROSCOPIC NEPHRECTOMY: SHX5140

## 2021-12-16 LAB — TYPE AND SCREEN
ABO/RH(D): O POS
Antibody Screen: NEGATIVE

## 2021-12-16 LAB — HEMOGLOBIN AND HEMATOCRIT, BLOOD
HCT: 38.5 % — ABNORMAL LOW (ref 39.0–52.0)
Hemoglobin: 12.9 g/dL — ABNORMAL LOW (ref 13.0–17.0)

## 2021-12-16 LAB — ABO/RH: ABO/RH(D): O POS

## 2021-12-16 SURGERY — NEPHRECTOMY, RADICAL, ROBOT-ASSISTED, LAPAROSCOPIC, ADULT
Anesthesia: General | Laterality: Left

## 2021-12-16 MED ORDER — HYDROMORPHONE HCL 1 MG/ML IJ SOLN
0.5000 mg | INTRAMUSCULAR | Status: DC | PRN
  Administered 2021-12-16: 1 mg via INTRAVENOUS
  Filled 2021-12-16: qty 1

## 2021-12-16 MED ORDER — LACTATED RINGERS IV SOLN: INTRAVENOUS | Status: DC | PRN

## 2021-12-16 MED ORDER — EPHEDRINE SULFATE-NACL 50-0.9 MG/10ML-% IV SOSY
PREFILLED_SYRINGE | INTRAVENOUS | Status: DC | PRN
  Administered 2021-12-16: 10 mg via INTRAVENOUS

## 2021-12-16 MED ORDER — ROCURONIUM BROMIDE 10 MG/ML (PF) SYRINGE
PREFILLED_SYRINGE | INTRAVENOUS | Status: AC
  Filled 2021-12-16: qty 10

## 2021-12-16 MED ORDER — ROCURONIUM BROMIDE 10 MG/ML (PF) SYRINGE
PREFILLED_SYRINGE | INTRAVENOUS | Status: DC | PRN
  Administered 2021-12-16: 50 mg via INTRAVENOUS
  Administered 2021-12-16: 30 mg via INTRAVENOUS
  Administered 2021-12-16: 20 mg via INTRAVENOUS
  Administered 2021-12-16: 50 mg via INTRAVENOUS

## 2021-12-16 MED ORDER — DEXAMETHASONE SODIUM PHOSPHATE 10 MG/ML IJ SOLN
INTRAMUSCULAR | Status: DC | PRN
  Administered 2021-12-16: 4 mg via INTRAVENOUS

## 2021-12-16 MED ORDER — HYOSCYAMINE SULFATE 0.125 MG SL SUBL: 0.1250 mg | SUBLINGUAL_TABLET | SUBLINGUAL | Status: DC | PRN

## 2021-12-16 MED ORDER — LACTATED RINGERS IV SOLN: INTRAVENOUS | Status: DC

## 2021-12-16 MED ORDER — CHLORHEXIDINE GLUCONATE 0.12 % MT SOLN
15.0000 mL | Freq: Once | OROMUCOSAL | Status: AC
  Administered 2021-12-16: 15 mL via OROMUCOSAL

## 2021-12-16 MED ORDER — SODIUM CHLORIDE (PF) 0.9 % IJ SOLN
INTRAMUSCULAR | Status: AC
Start: 2021-12-16 — End: ?
  Filled 2021-12-16: qty 20

## 2021-12-16 MED ORDER — ROCURONIUM BROMIDE 10 MG/ML (PF) SYRINGE
PREFILLED_SYRINGE | INTRAVENOUS | Status: AC
Start: 2021-12-16 — End: ?
  Filled 2021-12-16: qty 10

## 2021-12-16 MED ORDER — EPHEDRINE 5 MG/ML INJ
INTRAVENOUS | Status: AC
  Filled 2021-12-16: qty 5

## 2021-12-16 MED ORDER — PROPOFOL 10 MG/ML IV BOLUS
INTRAVENOUS | Status: DC | PRN
  Administered 2021-12-16: 160 mg via INTRAVENOUS

## 2021-12-16 MED ORDER — HYDROMORPHONE HCL 1 MG/ML IJ SOLN
INTRAMUSCULAR | Status: AC
  Administered 2021-12-16: 0.5 mg via INTRAVENOUS
  Filled 2021-12-16: qty 2

## 2021-12-16 MED ORDER — MIDAZOLAM HCL 2 MG/2ML IJ SOLN
INTRAMUSCULAR | Status: AC
  Filled 2021-12-16: qty 2

## 2021-12-16 MED ORDER — TRIPLE ANTIBIOTIC 3.5-400-5000 EX OINT: 1.0000 "application " | TOPICAL_OINTMENT | Freq: Three times a day (TID) | CUTANEOUS | Status: DC | PRN

## 2021-12-16 MED ORDER — ORAL CARE MOUTH RINSE: 15.0000 mL | Freq: Once | OROMUCOSAL | Status: AC

## 2021-12-16 MED ORDER — LACTATED RINGERS IR SOLN
Status: DC | PRN
  Administered 2021-12-16: 1000 mL

## 2021-12-16 MED ORDER — ONDANSETRON HCL 4 MG/2ML IJ SOLN: 4.0000 mg | INTRAMUSCULAR | Status: DC | PRN

## 2021-12-16 MED ORDER — HYDROCODONE-ACETAMINOPHEN 5-325 MG PO TABS: 1.0000 | ORAL_TABLET | Freq: Four times a day (QID) | ORAL | 0 refills | Status: DC | PRN

## 2021-12-16 MED ORDER — OXYCODONE HCL 5 MG PO TABS
5.0000 mg | ORAL_TABLET | ORAL | Status: DC | PRN
  Administered 2021-12-16: 5 mg via ORAL
  Filled 2021-12-16 (×3): qty 1

## 2021-12-16 MED ORDER — BUPIVACAINE LIPOSOME 1.3 % IJ SUSP
INTRAMUSCULAR | Status: AC
  Filled 2021-12-16: qty 20

## 2021-12-16 MED ORDER — DIPHENHYDRAMINE HCL 50 MG/ML IJ SOLN: 12.5000 mg | Freq: Four times a day (QID) | INTRAMUSCULAR | Status: DC | PRN

## 2021-12-16 MED ORDER — SUGAMMADEX SODIUM 200 MG/2ML IV SOLN
INTRAVENOUS | Status: DC | PRN
  Administered 2021-12-16: 200 mg via INTRAVENOUS

## 2021-12-16 MED ORDER — DEXAMETHASONE SODIUM PHOSPHATE 10 MG/ML IJ SOLN
INTRAMUSCULAR | Status: AC
  Filled 2021-12-16: qty 1

## 2021-12-16 MED ORDER — FENTANYL CITRATE (PF) 100 MCG/2ML IJ SOLN
INTRAMUSCULAR | Status: AC
  Filled 2021-12-16: qty 2

## 2021-12-16 MED ORDER — DOCUSATE SODIUM 100 MG PO CAPS: 100.0000 mg | ORAL_CAPSULE | Freq: Two times a day (BID) | ORAL | Status: DC

## 2021-12-16 MED ORDER — HEMOSTATIC AGENTS (NO CHARGE) OPTIME
TOPICAL | Status: DC | PRN
  Administered 2021-12-16: 1

## 2021-12-16 MED ORDER — FENTANYL CITRATE (PF) 100 MCG/2ML IJ SOLN
INTRAMUSCULAR | Status: DC | PRN
  Administered 2021-12-16 (×4): 50 ug via INTRAVENOUS
  Administered 2021-12-16: 100 ug via INTRAVENOUS
  Administered 2021-12-16 (×2): 50 ug via INTRAVENOUS

## 2021-12-16 MED ORDER — PROPOFOL 10 MG/ML IV BOLUS
INTRAVENOUS | Status: AC
  Filled 2021-12-16: qty 20

## 2021-12-16 MED ORDER — ONDANSETRON HCL 4 MG/2ML IJ SOLN
INTRAMUSCULAR | Status: DC | PRN
  Administered 2021-12-16: 4 mg via INTRAVENOUS

## 2021-12-16 MED ORDER — CEFAZOLIN SODIUM-DEXTROSE 2-4 GM/100ML-% IV SOLN
INTRAVENOUS | Status: AC
  Filled 2021-12-16: qty 100

## 2021-12-16 MED ORDER — STERILE WATER FOR IRRIGATION IR SOLN
Status: DC | PRN
  Administered 2021-12-16: 1000 mL

## 2021-12-16 MED ORDER — SODIUM CHLORIDE 0.9 % IV SOLN: INTRAVENOUS | Status: DC

## 2021-12-16 MED ORDER — MIDAZOLAM HCL 2 MG/2ML IJ SOLN
INTRAMUSCULAR | Status: DC | PRN
  Administered 2021-12-16: 2 mg via INTRAVENOUS

## 2021-12-16 MED ORDER — BUPIVACAINE LIPOSOME 1.3 % IJ SUSP
INTRAMUSCULAR | Status: DC | PRN
  Administered 2021-12-16: 20 mL

## 2021-12-16 MED ORDER — DOCUSATE SODIUM 100 MG PO CAPS
100.0000 mg | ORAL_CAPSULE | Freq: Two times a day (BID) | ORAL | Status: DC
  Administered 2021-12-16 – 2021-12-17 (×2): 100 mg via ORAL
  Filled 2021-12-16 (×2): qty 1

## 2021-12-16 MED ORDER — ACETAMINOPHEN 500 MG PO TABS
1000.0000 mg | ORAL_TABLET | Freq: Four times a day (QID) | ORAL | Status: AC
  Administered 2021-12-16 – 2021-12-17 (×4): 1000 mg via ORAL
  Filled 2021-12-16 (×4): qty 2

## 2021-12-16 MED ORDER — SODIUM CHLORIDE (PF) 0.9 % IJ SOLN
INTRAMUSCULAR | Status: DC | PRN
  Administered 2021-12-16: 20 mL

## 2021-12-16 MED ORDER — CEFAZOLIN SODIUM-DEXTROSE 2-4 GM/100ML-% IV SOLN
2.0000 g | Freq: Once | INTRAVENOUS | Status: AC
  Administered 2021-12-16: 2 g via INTRAVENOUS

## 2021-12-16 MED ORDER — HYDROMORPHONE HCL 1 MG/ML IJ SOLN
0.2500 mg | INTRAMUSCULAR | Status: DC | PRN
  Administered 2021-12-16: 0.5 mg via INTRAVENOUS

## 2021-12-16 MED ORDER — DIPHENHYDRAMINE HCL 12.5 MG/5ML PO ELIX: 12.5000 mg | ORAL_SOLUTION | Freq: Four times a day (QID) | ORAL | Status: DC | PRN

## 2021-12-16 MED ORDER — ONDANSETRON HCL 4 MG/2ML IJ SOLN
INTRAMUSCULAR | Status: AC
  Filled 2021-12-16: qty 2

## 2021-12-16 MED ORDER — LIDOCAINE HCL (CARDIAC) PF 100 MG/5ML IV SOSY
PREFILLED_SYRINGE | INTRAVENOUS | Status: DC | PRN
  Administered 2021-12-16: 80 mg via INTRAVENOUS

## 2021-12-16 MED ORDER — PANTOPRAZOLE SODIUM 40 MG PO TBEC
40.0000 mg | DELAYED_RELEASE_TABLET | Freq: Every day | ORAL | Status: DC
  Administered 2021-12-17: 40 mg via ORAL
  Filled 2021-12-16: qty 1

## 2021-12-16 MED ORDER — LIDOCAINE HCL (PF) 2 % IJ SOLN
INTRAMUSCULAR | Status: AC
  Filled 2021-12-16: qty 5

## 2021-12-16 SURGICAL SUPPLY — 73 items
APPLICATOR SURGIFLO ENDO (HEMOSTASIS) ×1 IMPLANT
BAG COUNTER SPONGE SURGICOUNT (BAG) ×2 IMPLANT
BAG LAPAROSCOPIC 12 15 PORT 16 (BASKET) ×1 IMPLANT
BAG RETRIEVAL 12/15 (BASKET) ×2
CHLORAPREP W/TINT 26 (MISCELLANEOUS) ×2 IMPLANT
CLIP LIGATING HEM O LOK PURPLE (MISCELLANEOUS) ×2 IMPLANT
CLIP LIGATING HEMO LOK XL GOLD (MISCELLANEOUS) ×2 IMPLANT
CLIP LIGATING HEMO O LOK GREEN (MISCELLANEOUS) ×3 IMPLANT
COVER SURGICAL LIGHT HANDLE (MISCELLANEOUS) ×2 IMPLANT
COVER TIP SHEARS 8 DVNC (MISCELLANEOUS) ×1 IMPLANT
COVER TIP SHEARS 8MM DA VINCI (MISCELLANEOUS) ×2
CUTTER ECHEON FLEX ENDO 45 340 (ENDOMECHANICALS) ×1 IMPLANT
DERMABOND ADVANCED (GAUZE/BANDAGES/DRESSINGS) ×1
DERMABOND ADVANCED .7 DNX12 (GAUZE/BANDAGES/DRESSINGS) ×2 IMPLANT
DRAIN CHANNEL 15F RND FF 3/16 (WOUND CARE) IMPLANT
DRAPE ARM DVNC X/XI (DISPOSABLE) ×4 IMPLANT
DRAPE COLUMN DVNC XI (DISPOSABLE) ×1 IMPLANT
DRAPE DA VINCI XI ARM (DISPOSABLE) ×8
DRAPE DA VINCI XI COLUMN (DISPOSABLE) ×2
DRAPE INCISE IOBAN 66X45 STRL (DRAPES) ×2 IMPLANT
DRAPE SHEET LG 3/4 BI-LAMINATE (DRAPES) ×2 IMPLANT
ELECT PENCIL ROCKER SW 15FT (MISCELLANEOUS) ×2 IMPLANT
ELECT REM PT RETURN 15FT ADLT (MISCELLANEOUS) ×2 IMPLANT
EVACUATOR SILICONE 100CC (DRAIN) IMPLANT
GLOVE BIO SURGEON STRL SZ 6.5 (GLOVE) ×2 IMPLANT
GLOVE BIOGEL M 7.0 STRL (GLOVE) ×2 IMPLANT
GLOVE BIOGEL PI IND STRL 7.0 (GLOVE) ×1 IMPLANT
GLOVE BIOGEL PI INDICATOR 7.0 (GLOVE) ×1
GLOVE SURG LX 7.5 STRW (GLOVE) ×2
GLOVE SURG LX STRL 7.5 STRW (GLOVE) ×2 IMPLANT
GOWN SRG XL LVL 4 BRTHBL STRL (GOWNS) ×1 IMPLANT
GOWN STRL NON-REIN XL LVL4 (GOWNS) ×2
GOWN STRL REUS W/ TWL XL LVL3 (GOWN DISPOSABLE) ×1 IMPLANT
GOWN STRL REUS W/TWL XL LVL3 (GOWN DISPOSABLE) ×2
HOLDER FOLEY CATH W/STRAP (MISCELLANEOUS) ×2 IMPLANT
IRRIG SUCT STRYKERFLOW 2 WTIP (MISCELLANEOUS) ×2
IRRIGATION SUCT STRKRFLW 2 WTP (MISCELLANEOUS) ×1 IMPLANT
KIT BASIN OR (CUSTOM PROCEDURE TRAY) ×2 IMPLANT
KIT TURNOVER KIT A (KITS) IMPLANT
LOOP VESSEL MAXI BLUE (MISCELLANEOUS) IMPLANT
MARKER SKIN DUAL TIP RULER LAB (MISCELLANEOUS) ×2 IMPLANT
NDL INSUFFLATION 14GA 120MM (NEEDLE) ×1 IMPLANT
NEEDLE INSUFFLATION 14GA 120MM (NEEDLE) ×2 IMPLANT
PATTIES SURGICAL .5 X3 (DISPOSABLE) IMPLANT
PROTECTOR NERVE ULNAR (MISCELLANEOUS) ×4 IMPLANT
RELOAD STAPLE 45 2.6 WHT THIN (STAPLE) IMPLANT
SEAL CANN UNIV 5-8 DVNC XI (MISCELLANEOUS) ×4 IMPLANT
SEAL XI 5MM-8MM UNIVERSAL (MISCELLANEOUS) ×8
SEALER VESSEL DA VINCI XI (MISCELLANEOUS)
SEALER VESSEL EXT DVNC XI (MISCELLANEOUS) IMPLANT
SET TUBE SMOKE EVAC HIGH FLOW (TUBING) ×2 IMPLANT
SOLUTION ELECTROLUBE (MISCELLANEOUS) ×2 IMPLANT
SPIKE FLUID TRANSFER (MISCELLANEOUS) ×2 IMPLANT
SPONGE T-LAP 4X18 ~~LOC~~+RFID (SPONGE) IMPLANT
STAPLE RELOAD 45 WHT (STAPLE) ×2 IMPLANT
STAPLE RELOAD 45MM WHITE (STAPLE) ×4
SURGIFLO W/THROMBIN 8M KIT (HEMOSTASIS) ×1 IMPLANT
SUT ETHILON 3 0 PS 1 (SUTURE) IMPLANT
SUT MNCRL AB 4-0 PS2 18 (SUTURE) ×4 IMPLANT
SUT PDS AB 1 CT1 27 (SUTURE) ×4 IMPLANT
SUT VIC AB 0 CT1 27 (SUTURE)
SUT VIC AB 0 CT1 27XBRD ANTBC (SUTURE) IMPLANT
SUT VIC AB 2-0 SH 27 (SUTURE) ×2
SUT VIC AB 2-0 SH 27X BRD (SUTURE) IMPLANT
SUT VICRYL 0 UR6 27IN ABS (SUTURE) ×1 IMPLANT
TOWEL OR 17X26 10 PK STRL BLUE (TOWEL DISPOSABLE) ×2 IMPLANT
TOWEL OR NON WOVEN STRL DISP B (DISPOSABLE) ×2 IMPLANT
TRAY FOLEY MTR SLVR 16FR STAT (SET/KITS/TRAYS/PACK) ×2 IMPLANT
TRAY LAPAROSCOPIC (CUSTOM PROCEDURE TRAY) ×2 IMPLANT
TROCAR KII 12X100 BLADELESS (ENDOMECHANICALS) ×2 IMPLANT
TROCAR UNIVERSAL OPT 12M 100M (ENDOMECHANICALS) ×2 IMPLANT
TROCAR Z-THREAD OPTICAL 5X100M (TROCAR) IMPLANT
WATER STERILE IRR 1000ML POUR (IV SOLUTION) ×2 IMPLANT

## 2021-12-16 NOTE — Anesthesia Preprocedure Evaluation (Addendum)
Anesthesia Evaluation  Patient identified by MRN, date of birth, ID band Patient awake    Reviewed: Allergy & Precautions, NPO status , Patient's Chart, lab work & pertinent test results  Airway Mallampati: II  TM Distance: >3 FB     Dental   Pulmonary neg pulmonary ROS,    breath sounds clear to auscultation       Cardiovascular negative cardio ROS   Rhythm:Regular Rate:Normal     Neuro/Psych negative neurological ROS     GI/Hepatic Neg liver ROS, GERD  ,  Endo/Other    Renal/GU History noted Dr. Nyoka Cowden     Musculoskeletal   Abdominal   Peds  Hematology   Anesthesia Other Findings   Reproductive/Obstetrics                             Anesthesia Physical Anesthesia Plan  ASA: 2  Anesthesia Plan: General   Post-op Pain Management:    Induction: Intravenous  PONV Risk Score and Plan: 2 and Ondansetron, Dexamethasone and Midazolam  Airway Management Planned: Oral ETT  Additional Equipment:   Intra-op Plan:   Post-operative Plan: Extubation in OR  Informed Consent: I have reviewed the patients History and Physical, chart, labs and discussed the procedure including the risks, benefits and alternatives for the proposed anesthesia with the patient or authorized representative who has indicated his/her understanding and acceptance.     Dental advisory given  Plan Discussed with: CRNA and Anesthesiologist  Anesthesia Plan Comments:         Anesthesia Quick Evaluation

## 2021-12-16 NOTE — Transfer of Care (Signed)
Immediate Anesthesia Transfer of Care Note  Patient: Ian [name]  Procedure(s) Performed: XI ROBOTIC ASSISTED LAPAROSCOPIC NEPHRECTOMY (Left)  Patient Location: PACU  Anesthesia Type:General  Level of Consciousness: awake and drowsy  Airway & Oxygen Therapy: Patient Spontanous Breathing and Patient connected to face mask oxygen  Post-op Assessment: Report given to RN and Post -op Vital signs reviewed and stable  Post vital signs: Reviewed and stable  Last Vitals:  Vitals Value Taken Time  BP 143/95 12/16/21 1531  Temp    Pulse 97 12/16/21 1532  Resp 12 12/16/21 1532  SpO2 100 % 12/16/21 1532  Vitals shown include unvalidated device data.  Last Pain:  Vitals:   12/16/21 0955  TempSrc:   PainSc: 0-No pain         Complications: No notable events documented.

## 2021-12-16 NOTE — H&P (Signed)
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   #1. 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 Findings revealed a subtle, small nodular densities in the lungs which were indeterminate. Repeat CT chest 07/2021 demonstrated stable to improving size and appearance of the subcentimeter lung nodules. There was an incidental finding of an indeterminate 3.4 cm low-density liver lesion in the lower right hepatic lobe. MRI of the abdomen 09/24/2021 demonstrated benign fluid-filled hemangiomas of the liver. MRI revealed heterogenously enhancing left renal mass measuring 3.5 cm x 3.0 cm with involvement of the adjacent left renal vein there is no metastatic disease or lymphadenopathy in the abdomen.  -He denies abdominal pain or flank pain. He denies gross hematuria. He denies bothersome lower urinary tract symptoms. He denies a family history of renal cancer. His grandfather had prostate cancer.   #2. Erectile dysfunction: He states he has had erectile dysfunction of the past 10 years and has previously been treated with tadalafil 20 mg with declining results. He rates 3/5 ability that he can get and keep an erection. He estimates about 70 to 80% rigidity. He is interested in trying another PDE 5 inhibitor. He is not interested in trying ICI at this time.   Patient currently denies fever, chills, sweats, nausea, vomiting, abdominal or flank pain, gross hematuria or dysuria.   He denies cardiac or pulmonary history. He denies prior abdominal surgeries. He denies taking anticoagulation.   He works as a Geologist, engineering.   12/06/2021: Here today for preoperative appointment prior to undergoing left robot assisted laparoscopic radical nephrectomy on 06/02. Overall doing well. Denies any changes in past medical history, prescription medications taken  on a daily basis, also no interval treatment for infection. He has had no interval surgical or procedural intervention. Not having any lower back, flank or abdominal pain/discomfort suggestive of obstructive uropathy. Denies any interval dysuria or gross hematuria. Otherwise voiding at his baseline with stable nonbothersome symptomology. Denies any recent chest pain, shortness of breath, numbness or tingling in the extremities, lightheadedness or dizziness. He has had no recent fevers or chills, nausea/vomiting. Denies any constipation or diarrhea.     ALLERGIES: No Known Drug Allergies    MEDICATIONS: Cialis 20 mg tablet  Omeprazole 20 mg capsule,delayed release  Allegra Allergy  Fish Oil  Glucosamine  Multivitamin  Vitamin B12     GU PSH: None   NON-GU PSH: None   GU PMH: ED due to arterial insufficiency - 10/25/2021 Left renal neoplasm - 10/25/2021    NON-GU PMH: GERD    FAMILY HISTORY: 1 Daughter - Runs in Family 1 son - Runs in Family High Blood Pressure - Father Kidney Stones - Father melanoma - Mother non-Hodgkin's lymphoma - Father Prostate Cancer - Grandfather   SOCIAL HISTORY: Marital Status: Married Ethnicity: Not Hispanic Or Latino; Race: White Current Smoking Status: Patient has never smoked.  Does not use smokeless tobacco. Drinks 2 drinks per day. Moderate Drinker.  Drinks 3 caffeinated drinks per day. Has not had a blood transfusion.    REVIEW OF SYSTEMS:    GU Review Male:   Patient denies frequent urination, hard to postpone urination, burning/ pain with urination, get up at night to urinate, leakage of urine, stream starts and stops, trouble starting your stream, have to strain to urinate , erection problems,  and penile pain.  Gastrointestinal (Upper):   Patient denies nausea, vomiting, and indigestion/ heartburn.  Gastrointestinal (Lower):   Patient denies diarrhea and constipation.  Constitutional:   Patient denies fever, night sweats, weight loss,  and fatigue.  Skin:   Patient denies skin rash/ lesion and itching.  Eyes:   Patient denies blurred vision and double vision.  Ears/ Nose/ Throat:   Patient denies sore throat and sinus problems.  Hematologic/Lymphatic:   Patient denies swollen glands and easy bruising.  Cardiovascular:   Patient denies leg swelling and chest pains.  Respiratory:   Patient denies cough and shortness of breath.  Endocrine:   Patient denies excessive thirst.  Musculoskeletal:   Patient denies back pain and joint pain.  Neurological:   Patient denies dizziness and headaches.  Psychologic:   Patient denies depression and anxiety.   VITAL SIGNS:      12/06/2021 10:05 AM  Weight 185 lb / 83.91 kg  Height 71 in / 180.34 cm  BP 141/90 mmHg  Pulse 57 /min  Temperature 97.7 F / 36.5 C  BMI 25.8 kg/m   MULTI-SYSTEM PHYSICAL EXAMINATION:    Constitutional: Well-nourished. No physical deformities. Normally developed. Good grooming.  Neck: Neck symmetrical, not swollen. Normal tracheal position.  Respiratory: No labored breathing, no use of accessory muscles.   Cardiovascular: Normal temperature, normal extremity pulses, no swelling, no varicosities.  Skin: No paleness, no jaundice, no cyanosis. No lesion, no ulcer, no rash.  Neurologic / Psychiatric: Oriented to time, oriented to place, oriented to person. No depression, no anxiety, no agitation.  Gastrointestinal: No mass, no tenderness, no rigidity, non obese abdomen.  Musculoskeletal: Normal gait and station of head and neck.     Complexity of Data:  Source Of History:  Patient, Medical Record Summary  Lab Test Review:   CBC with Diff, CMP  Records Review:   Previous Doctor Records, Previous Hospital Records, Previous Patient Records  Urine Test Review:   Urinalysis  X-Ray Review: C.T. Abdomen/Pelvis: Reviewed Films. Reviewed Report.  MRI Abdomen: Reviewed Films. Reviewed Report.     10/25/21  General Chemistry  Sodium 142 mEq/L  Potassium 4.6 mEq/L   BUN 20 mg/dL  Creatinine 0.5 mg/dL  Chloride 100 mEq/L  CO2 30 mEq/L  Glucose 103 mg/dL  Calcium 10.6 mg/dL  Protein, Total 8.6 g/dL  Albumin 4.5 g/dL  Bilirubin, Total 0.8 mg/dL  Alkaline Phosphatase 78 IU/L  ALT 20 IU/L  AST 30 IU/L  eGFR African American 143.4   eGFR Non-Afr. American 123.7   BUN/Creatinine Ratio 40.0 Ratio  CBC  WBC 4.2 K/uL  RBC 4.06 M/uL  Hemoglobin 13.5 g/dL  Hematocrit % 39.4 %  MCV 97 fL  MCH 33 pg  MCHC 34.2 g/dL  RDW 11.2 %  Platelets 258 K/uL  MPV 11.5 fL  Neutrophils % 38.8 %  Lymphocytes % 49.0 %  Monocytes % 9.2 %  Eosinophils % 2.7 %  Basophils % 0.3 %  Neutrophils 1.6 K/uL  Lymphocytes 2.1 K/uL  Monocytes 0.4 K/uL  Eosinophils 0.11 K/uL  Basophils 0.0 K/uL   PROCEDURES:          Urinalysis Dipstick Dipstick Cont'd  Color: Yellow Bilirubin: Neg mg/dL  Appearance: Clear Ketones: Neg mg/dL  Specific Gravity: 1.015 Blood: Neg ery/uL  pH: 7.0 Protein: Trace mg/dL  Glucose: Neg mg/dL Urobilinogen: 0.2 mg/dL    Nitrites: Neg    Leukocyte Esterase: Neg leu/uL    ASSESSMENT:      ICD-10 Details  1  GU:   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 Urine culture sent today to serve as a baseline. He will proceed with previously scheduled left robot assisted laparoscopic radical nephrectomy on 06/02 with Dr Abner Greenspan.        Next Appointment:      Next Appointment: 12/16/2021 11:30 AM    Appointment Type: Surgery      Location: [facility] [phone]    Provider: Rexene Alberts, M.D.    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 Most recent imaging, MRI 11/25/2021 with 3.8x3.4cm mass with tumor thrombus in the adjacent proximal renal vein at the anterior margin of the mass, not significantly changed from MRI abdomen 09/24/2021. Denies abdominal pain, fevers, chills. He denies cardiac or pulmonary history. He denies prior abdominal surgeries. He denies taking anticoagulation.     Past Medical History:  Diagnosis Date   Androgen deficiency    low "T"   GERD (gastroesophageal reflux disease)    HLD (hyperlipidemia)     Past Surgical History:  Procedure Laterality Date   UPPER GI ENDOSCOPY  2008    Allergies:  Allergies  Allergen Reactions   Testosterone Cypionate Swelling    Injection - Swelling at site     Family History  Problem Relation Age of Onset   Melanoma Mother    Non-Hodgkin's lymphoma Father     Social History:  reports that he has never smoked. He has never used smokeless tobacco. He reports current alcohol use of about 14.0 standard drinks per week. He reports that he does not use drugs.  ROS: A complete review of systems was performed.  All systems are negative except for pertinent findings as noted.  Physical Exam:  Vital signs in last 24 hours: Temp:  [98.3 F (36.8 C)] 98.3 F (36.8 C) (06/02 0931) Pulse Rate:  [85] 85 (06/02 0931) Resp:  [18] 18 (06/02 0931) BP: (147)/(92) 147/92 (06/02 0931) SpO2:  [99 %] 99 % (06/02 0931) Weight:  [85.7 kg] 85.7 kg (06/02 0955) Constitutional:  Alert and oriented, No acute distress Cardiovascular: Regular rate and rhythm Respiratory: Normal respiratory effort, Lungs clear bilaterally GI: Abdomen  is soft, nontender, nondistended, no abdominal masses GU: No CVA tenderness Lymphatic: No lymphadenopathy Neurologic: Grossly intact, no focal deficits Psychiatric: Normal mood and affect  Laboratory Data:  No results for input(s): WBC, HGB, HCT, PLT in the last 72 hours.  No results for input(s): NA, K, CL, GLUCOSE, BUN, CALCIUM, CREATININE in the last 72 hours.  Invalid input(s): CO3   Results for orders placed or performed during the hospital encounter of 12/16/21 (from the  past 24 hour(s))  Type and screen [place]     Status: None (Preliminary result)   Collection Time: 12/16/21 10:25 AM  Result Value Ref Range   ABO/RH(D) PENDING    Antibody Screen PENDING    Sample Expiration      12/19/2021,2359 Performed at [facility] [place] [address] [place], [place] [address]    No results found for this or any previous visit (from the past 240 hour(s)).  Renal Function: No results for input(s): CREATININE in the last 168 hours. Estimated Creatinine Clearance: 55.8 mL/min (A) (by C-G formula based on SCr of 1.63 mg/dL (H)).  Radiologic Imaging: No results found.  I independently reviewed the above imaging studies.  Assessment and Plan Mosie Angus is a 56 y.o. 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  We have discussed the risks of treatment in detail including but not limited to bleeding, infection, heart attack, stroke, death, venothromoboembolism, cancer recurrence, injury/damage to surrounding organs and structures, urine leak, the possibility of open surgical conversion for patients undergoing minimally invasive surgery, the risk of developing chronic kidney disease and its associated implications, and the potential risk of end stage renal disease possibly necessitating dialysis.   Matt R. Vuong Musa MD 12/16/2021, 11:03 AM   [facility] Pager: [phone]): [phone]

## 2021-12-16 NOTE — Anesthesia Procedure Notes (Signed)
Procedure Name: Intubation Date/Time: 12/16/2021 11:52 AM Performed by: Niel Hummer, CRNA Pre-anesthesia Checklist: Patient identified, Emergency Drugs available, Suction available and Patient being monitored Patient Re-evaluated:Patient Re-evaluated prior to induction Oxygen Delivery Method: Circle system utilized Preoxygenation: Pre-oxygenation with 100% oxygen Induction Type: IV induction Ventilation: Mask ventilation without difficulty Laryngoscope Size: Mac and 4 Grade View: Grade I Tube type: Oral Tube size: 7.5 mm Number of attempts: 1 Airway Equipment and Method: Stylet Placement Confirmation: ETT inserted through vocal cords under direct vision, positive ETCO2 and breath sounds checked- equal and bilateral Secured at: 23 cm Tube secured with: Tape Dental Injury: Teeth and Oropharynx as per pre-operative assessment

## 2021-12-16 NOTE — Op Note (Signed)
Operative Note  Preoperative diagnosis:  1.  Left renal mass  Postoperative diagnosis: 1.  Left renal mass  Procedure(s): 1.  Robotic assisted laparoscopic left radical nephrectomy (adrenal sparing) 2. Regional peri-aortic lymph node dissection 3. Intra-operative renal ultrasound  Surgeon: Rexene Alberts, MD  Assistants:  Debbrah Alar, PA  An assistant was required for this surgical procedure.  The duties of the assistant included but were not limited to suctioning, passing suture, camera manipulation, retraction.  This procedure would not be able to be performed without an Environmental consultant.   Anesthesia:  General  Complications:  None  EBL:  65m  Specimens: 1.  ID Type Source Tests Collected by Time Destination  1 : Left Kidney Tissue PATH GU resection / TURBT / partial nephrectomy SURGICAL PATHOLOGY GJanith Lima MD 12/16/2021 1307   2 : Paraaortic Lymph Node Tissue PATH Lymph node excision SURGICAL PATHOLOGY GJanith Lima MD 12/16/2021 1443     Drains/Catheters: 1.  16 Fr foley catheter  Intraoperative findings:   1 renal artery. 1 renal vein. Intraoperative ultrasound confirm renal vein thrombus that was just into the renal vein. Successful staple ligation of artery and vein with no gross positive margin. 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  Description of procedure: The indications, alternatives, benefits and risks were discussed with the patient and informed consent was obtained.  The patient was brought onto the operating room table, positioned supine and secured to the bed with a safety strap.  All pressure points were carefully padded and pneumatic compression devices were placed on the lower extremities.  After the administration of intravenous antibiotics  and general endotracheal anesthesia, a 16 French urethral catheter was inserted to drain the bladder.  The patient was repositioned in the right lateral decubitus with the left side elevated at a 70 degree angle with the right lower leg flexed and the left upper leg extended.  An axillary roll was positioned to protect the brachial plexus and a gel pad was placed to support the back.  Multiple pillows were used to pad beneath and between the lower extremities and to ensure adequate cushioning. The left arm was placed in an armrest and carefully padded. The patient was secured in place across the hips, chest and legs with foam padding and silk tape, and the table was flexed.  The patient was prepped and draped in the standard sterile manner.  The radiographic images were in the room.  Timeout was completed verifying the correct patient, surgical procedure, site and positioning, prior to beginning the procedure.  Pneumoperitoneum was introduced by placing a Veress needle just lateral to the rectus belly into the abdomen and insufflating with CO2 to a pressure of 15 mmHg.  The camera trocar was placed approximately 7-1/2 cm inferior and 2 cm medially to the planned position of the left robotic arm which was approximately 2 fingerbreadths inferior to the costal margin.  The 0 degree lens was inserted under direct visualization.  The abdominal cavity was examined for any signs of injury, adhesions and identification of anatomic landmarks. We then placed our right robotic trocar, left robotic trocar and fourth arm trocar.  A 12 mm assistant port was placed supra-umbilically. The robot was then docked.  The white line of Toldt was incised and the colon was reflected medially,  exposing Gerota's fascia. The splenorenal nad splenophrenic ligaments were incised to mobilize the spleen. The tail of the pancreas was mobilized medially. The retroperitoneal fascia overlying the renal vessels was carefully separated, exposing  the underlying renal vein.  I was able to achieve an adequate lift using the fourth arm to elevate the kidney, further exposing the hilum.  The left renal vein and left renal artery were carefully dissected and mobilized.  We encountered a small lumbar vein adjacent to the insertion of the left gonadal vein into the left renal vein.  This lumbar vein was ligated with hemo-lock clips in 2 places and incised sharply.  This provided [place] excellent exposure to the left renal hilum.  I decided to take the gonadal vein to ensure excellent length on the renal vein.  Intraoperative renal ultrasound was performed to aid in visualizing the mass and the tumor thrombus was seen just within the renal vein adjacent to the mass.  This did not extend far into the renal vein.  A laparoscopic battery-operated stapler with a vascular load was used to control and ligate the left renal artery.  Following this, we then visualized the renal vein and there was no tumor thrombus identified within the renal vein.  The vein was flat.  A separate vascular load was used to secure the left renal vein.  Next, I mobilized the upper pole and any remaining attachments to the left kidney. Special attention was given to the cranial connections to the adrenal gland, which were carefully divided, completely freeing the adrenal off the superior pole of the kidney.  The left ureter was divided between clips.   The kidney was placed in an Endo Catch bag.  The renal fossa and remainder of the operating field were inspected for bleeding or injury.  I visualized 2 prominent periaortic lymph nodes. These were carefully dissected using Weck clips.  Excellent hemostasis obtained. The insufflation pressure was reduced, again confirming the absence of bleeding.  Excellent hemostasis was obtained.  A mini-Gibson incision was made at the site of the fourth arm trocar.  The fascia was opened and the specimen was removed in a muscle-sparing fashion.  The ports  were removed under direct vision.  Carter-Thomason device was used to close the 12 mm port.  All wounds were copiously irrigated and then infiltrated with Exparel.  The muscle was reapproximated using 0 Vicryl as a first layer. 1-0 PDS was used to close the fascia as a second layer.  The skin incisions were reapproximated with 4-0 Monocryl.  Dermabond was then applied on the skin.  At the end of the procedure, all counts were correct.  The patient tolerated the procedure well and was taken to the recovery room in satisfactory condition.  Matt R. [facility]  Pager: [phone]

## 2021-12-16 NOTE — Discharge Instructions (Signed)

## 2021-12-16 NOTE — Anesthesia Postprocedure Evaluation (Signed)
Anesthesia Post Note  Patient: Ian [name]  Procedure(s) Performed: XI ROBOTIC ASSISTED LAPAROSCOPIC NEPHRECTOMY (Left)     Patient location during evaluation: PACU Anesthesia Type: General Level of consciousness: awake and alert Pain management: pain level controlled Vital Signs Assessment: post-procedure vital signs reviewed and stable Respiratory status: spontaneous breathing, nonlabored ventilation, respiratory function stable and patient connected to nasal cannula oxygen Cardiovascular status: blood pressure returned to baseline and stable Postop Assessment: no apparent nausea or vomiting Anesthetic complications: no   No notable events documented.  Last Vitals:  Vitals:   12/16/21 1630 12/16/21 1645  BP: (!) 144/97 (!) 157/97  Pulse: 63 93  Resp: 12 13  Temp: 36.6 C   SpO2: 98% 99%    Last Pain:  Vitals:   12/16/21 1645  TempSrc:   PainSc: 4                  Tiajuana Amass

## 2021-12-17 ENCOUNTER — Encounter (HOSPITAL_COMMUNITY): Payer: Self-pay | Admitting: Urology

## 2021-12-17 LAB — BASIC METABOLIC PANEL
Anion gap: 8 (ref 5–15)
BUN: 19 mg/dL (ref 6–20)
CO2: 26 mmol/L (ref 22–32)
Calcium: 8.7 mg/dL — ABNORMAL LOW (ref 8.9–10.3)
Chloride: 99 mmol/L (ref 98–111)
Creatinine, Ser: 1.18 mg/dL (ref 0.61–1.24)
GFR, Estimated: >60 mL/min (ref >60)
Glucose, Bld: 133 mg/dL — ABNORMAL HIGH (ref 70–99)
Potassium: 3.7 mmol/L (ref 3.5–5.1)
Sodium: 133 mmol/L — ABNORMAL LOW (ref 135–145)

## 2021-12-17 LAB — HEMOGLOBIN AND HEMATOCRIT, BLOOD
HCT: 34.2 % — ABNORMAL LOW (ref 39.0–52.0)
Hemoglobin: 11.6 g/dL — ABNORMAL LOW (ref 13.0–17.0)

## 2021-12-17 NOTE — Progress Notes (Signed)
Pt discharged home today per Dr. Diona Fanti. Pt's IV site D/C'd and WDL. Pt's VSS. Pt provided with home medication list, discharge instructions and prescriptions. Verbalized understanding. Pt left floor via WC in stable condition accompanied by RN.

## 2021-12-17 NOTE — Plan of Care (Signed)
  Problem: Education: Goal: Knowledge of General Education information will improve Description: Including pain rating scale, medication(s)/side effects and non-pharmacologic comfort measures Outcome: Completed/Met   Problem: Health Behavior/Discharge Planning: Goal: Ability to manage health-related needs will improve 12/17/2021 1402 by Annie Sable, RN Outcome: Completed/Met 12/17/2021 0849 by Annie Sable, RN Outcome: Progressing   Problem: Clinical Measurements: Goal: Ability to maintain clinical measurements within normal limits will improve 12/17/2021 1402 by Annie Sable, RN Outcome: Completed/Met 12/17/2021 0849 by Annie Sable, RN Outcome: Progressing Goal: Will remain free from infection 12/17/2021 1402 by Annie Sable, RN Outcome: Completed/Met 12/17/2021 0849 by Annie Sable, RN Outcome: Progressing Goal: Diagnostic test results will improve 12/17/2021 1402 by Annie Sable, RN Outcome: Completed/Met 12/17/2021 0849 by Annie Sable, RN Outcome: Progressing Goal: Respiratory complications will improve Outcome: Completed/Met Goal: Cardiovascular complication will be avoided Outcome: Completed/Met   Problem: Activity: Goal: Risk for activity intolerance will decrease 12/17/2021 1402 by Annie Sable, RN Outcome: Completed/Met 12/17/2021 0849 by Annie Sable, RN Outcome: Progressing   Problem: Nutrition: Goal: Adequate nutrition will be maintained 12/17/2021 1402 by Annie Sable, RN Outcome: Completed/Met 12/17/2021 0849 by Annie Sable, RN Outcome: Progressing   Problem: Coping: Goal: Level of anxiety will decrease 12/17/2021 1402 by Annie Sable, RN Outcome: Completed/Met 12/17/2021 0849 by Annie Sable, RN Outcome: Progressing   Problem: Elimination: Goal: Will not experience complications related to bowel motility 12/17/2021 1402 by Annie Sable, RN Outcome: Completed/Met 12/17/2021 0849 by Annie Sable, RN Outcome:  Progressing Goal: Will not experience complications related to urinary retention 12/17/2021 1402 by Annie Sable, RN Outcome: Completed/Met 12/17/2021 0849 by Annie Sable, RN Outcome: Progressing   Problem: Pain Managment: Goal: General experience of comfort will improve 12/17/2021 1402 by Annie Sable, RN Outcome: Completed/Met 12/17/2021 0849 by Annie Sable, RN Outcome: Progressing   Problem: Safety: Goal: Ability to remain free from injury will improve 12/17/2021 1402 by Annie Sable, RN Outcome: Completed/Met 12/17/2021 0849 by Annie Sable, RN Outcome: Progressing   Problem: Skin Integrity: Goal: Risk for impaired skin integrity will decrease 12/17/2021 1402 by Annie Sable, RN Outcome: Completed/Met 12/17/2021 0849 by Annie Sable, RN Outcome: Adequate for Discharge   Problem: Education: Goal: Knowledge of the prescribed therapeutic regimen will improve Outcome: Completed/Met   Problem: Bowel/Gastric: Goal: Gastrointestinal status for postoperative course will improve 12/17/2021 1402 by Annie Sable, RN Outcome: Completed/Met 12/17/2021 0849 by Annie Sable, RN Outcome: Progressing   Problem: Clinical Measurements: Goal: Postoperative complications will be avoided or minimized 12/17/2021 1402 by Annie Sable, RN Outcome: Completed/Met 12/17/2021 0849 by Annie Sable, RN Outcome: Progressing   Problem: Respiratory: Goal: Ability to achieve and maintain a regular respiratory rate will improve 12/17/2021 1402 by Annie Sable, RN Outcome: Completed/Met 12/17/2021 0849 by Annie Sable, RN Outcome: Progressing   Problem: Skin Integrity: Goal: Demonstration of wound healing without infection will improve 12/17/2021 1402 by Annie Sable, RN Outcome: Completed/Met 12/17/2021 0849 by Annie Sable, RN Outcome: Adequate for Discharge   Problem: Urinary Elimination: Goal: Ability to avoid or minimize complications of infection will  improve 12/17/2021 1402 by Annie Sable, RN Outcome: Completed/Met 12/17/2021 0849 by Annie Sable, RN Outcome: Progressing Goal: Ability to achieve and maintain urine output will improve 12/17/2021 1402 by Annie Sable, RN Outcome: Completed/Met 12/17/2021 0849 by Annie Sable, RN Outcome: Progressing

## 2021-12-17 NOTE — Plan of Care (Signed)
  Problem: Health Behavior/Discharge Planning: Goal: Ability to manage health-related needs will improve Outcome: Progressing   Problem: Clinical Measurements: Goal: Ability to maintain clinical measurements within normal limits will improve Outcome: Progressing Goal: Will remain free from infection Outcome: Progressing Goal: Diagnostic test results will improve Outcome: Progressing   Problem: Activity: Goal: Risk for activity intolerance will decrease Outcome: Progressing   Problem: Nutrition: Goal: Adequate nutrition will be maintained Outcome: Progressing   Problem: Coping: Goal: Level of anxiety will decrease Outcome: Progressing   Problem: Elimination: Goal: Will not experience complications related to bowel motility Outcome: Progressing Goal: Will not experience complications related to urinary retention Outcome: Progressing   Problem: Pain Managment: Goal: General experience of comfort will improve Outcome: Progressing   Problem: Safety: Goal: Ability to remain free from injury will improve Outcome: Progressing   Problem: Bowel/Gastric: Goal: Gastrointestinal status for postoperative course will improve Outcome: Progressing   Problem: Clinical Measurements: Goal: Postoperative complications will be avoided or minimized Outcome: Progressing   Problem: Respiratory: Goal: Ability to achieve and maintain a regular respiratory rate will improve Outcome: Progressing   Problem: Urinary Elimination: Goal: Ability to avoid or minimize complications of infection will improve Outcome: Progressing Goal: Ability to achieve and maintain urine output will improve Outcome: Progressing   Problem: Education: Goal: Knowledge of General Education information will improve Description: Including pain rating scale, medication(s)/side effects and non-pharmacologic comfort measures Outcome: Completed/Met   Problem: Clinical Measurements: Goal: Respiratory complications  will improve Outcome: Completed/Met Goal: Cardiovascular complication will be avoided Outcome: Completed/Met   Problem: Education: Goal: Knowledge of the prescribed therapeutic regimen will improve Outcome: Completed/Met   Problem: Skin Integrity: Goal: Risk for impaired skin integrity will decrease Outcome: Adequate for Discharge   Problem: Skin Integrity: Goal: Demonstration of wound healing without infection will improve Outcome: Adequate for Discharge

## 2021-12-17 NOTE — Discharge Summary (Signed)
Patient ID: Ian [name] MRN: [id number] DOB/AGE: 09-25-1965 56 y.o.  Admit date: 12/16/2021 Discharge date: 12/17/2021  Primary Care Physician:  Deland Pretty, MD  Discharge Diagnoses:   Present on Admission:  Renal mass    Discharge Medications: Allergies as of 12/17/2021       Reactions   Testosterone Cypionate Swelling   Injection - Swelling at site         Medication List     STOP taking these medications    B-12 1000 MCG Subl   Fish Oil 1000 MG Caps   GLUCOSAMINE-CHONDROITIN PO   MENS MULTIVITAMIN PO       TAKE these medications    docusate sodium 100 MG capsule Commonly known as: COLACE Take 1 capsule (100 mg total) by mouth 2 (two) times daily.   fexofenadine 180 MG tablet Commonly known as: ALLEGRA Take 180 mg by mouth daily as needed for allergies or rhinitis.   fluticasone 50 MCG/ACT nasal spray Commonly known as: FLONASE Place 1 spray into both nostrils daily.   HYDROcodone-acetaminophen 5-325 MG tablet Commonly known as: Norco Take 1-2 tablets by mouth every 6 (six) hours as needed for moderate pain or severe pain.   omeprazole 20 MG capsule Commonly known as: PRILOSEC Take 20 mg by mouth daily.   tadalafil 20 MG tablet Commonly known as: CIALIS Take 20 mg by mouth daily as needed for erectile dysfunction.         Significant Diagnostic Studies:  No results found.  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  Hospital Course: Pt tolerated RALt RN w/ retroperitoneal node dissection well. On POD 1 he was ambulating and tolerated a regular diet. Bowel fxn had returned. He wad discharged.   Day of Discharge BP (!) 149/91 (BP Location: Right Arm)   Pulse 77   Temp 98.2 F (36.8 C) (Oral)   Resp 18   Ht '5\' 11"'$  (1.803 m)   Wt 85.7 kg   SpO2 100%   BMI 26.35 kg/m   Results for orders placed or performed during the hospital  encounter of 12/16/21 (from the past 24 hour(s))  Hemoglobin and hematocrit, blood     Status: Abnormal   Collection Time: 12/16/21  5:15 PM  Result Value Ref Range   Hemoglobin 12.9 (L) 13.0 - 17.0 g/dL   HCT 38.5 (L) 39.0 - 71.6 %  Basic metabolic panel     Status: Abnormal   Collection Time: 12/17/21  5:39 AM  Result Value Ref Range   Sodium 133 (L) 135 - 145 mmol/L   Potassium 3.7 3.5 - 5.1 mmol/L   Chloride 99 98 - 111 mmol/L   CO2 26 22 - 32 mmol/L   Glucose, Bld 133 (H) 70 - 99 mg/dL   BUN 19 6 - 20 mg/dL   Creatinine, Ser 1.18 0.61 - 1.24 mg/dL   Calcium 8.7 (L) 8.9 - 10.3 mg/dL   GFR, Estimated >60 >60 mL/min   Anion gap 8 5 - 15  Hemoglobin and hematocrit, blood     Status: Abnormal   Collection Time: 12/17/21  5:39 AM  Result Value Ref Range   Hemoglobin 11.6 (L) 13.0 - 17.0 g/dL   HCT 34.2 (L) 39.0 - 52.0 %    Physical Exam: General: Alert and awake oriented x3 not in any acute distress. HEENT: anicteric sclera, pupils reactive to light  and accommodation CVS: S1-S2 clear no murmur rubs or gallops Chest: clear to auscultation bilaterally, no wheezing rales or rhonchi Abdomen: soft nontender, nondistended, normal bowel sounds, no organomegaly Extremities: no cyanosis, clubbing or edema noted bilaterally Neuro: Cranial nerves II-XII intact, no focal neurological deficits  Disposition:  Home  Diet:  Regular  Activity:  Gradually increase   TESTS THAT NEED FOLLOW-UP   Pathology review  DISCHARGE FOLLOW-UP   Follow-up Information     Janith Lima, MD Follow up on 12/26/2021.   Specialty: Urology Why: at 9:45 Contact information: [address] [place] [address] [phone]                 Time spent on Discharge:   10 mins  Signed: Lillette Boxer Annamary Buschman 12/17/2021, 2:02 PM

## 2021-12-17 NOTE — Progress Notes (Signed)
1 Day Post-Op Subjective: Patient reports pain is manageable.  He has had flatus.  No nausea.  Objective: Vital signs in last 24 hours: Temp:  [97.8 F (36.6 C)-98.3 F (36.8 C)] 97.9 F (36.6 C) (06/03 0530) Pulse Rate:  [63-97] 95 (06/03 0530) Resp:  [12-22] 16 (06/03 0530) BP: (137-162)/(80-103) 137/83 (06/03 0530) SpO2:  [95 %-100 %] 100 % (06/03 0530) Weight:  [85.7 kg] 85.7 kg (06/02 0955)  Intake/Output from previous day: 06/02 0701 - 06/03 0700 In: 3432.8 [P.O.:480; I.V.:2852.8; IV Piggyback:100] Out: 700 [Urine:650; Blood:50] Intake/Output this shift: No intake/output data recorded.  Physical Exam:  Constitutional: Vital signs reviewed. WD WN in NAD   Eyes: PERRL, No scleral icterus.   Cardiovascular: RRR Pulmonary/Chest: Normal effort Abdominal: Slightly distended.  Appropriate tenderness.  No abnormalities of port sites.  Lab Results: Recent Labs    12/16/21 1715 12/17/21 0539  HGB 12.9* 11.6*  HCT 38.5* 34.2*   BMET Recent Labs    12/17/21 0539  NA 133*  K 3.7  CL 99  CO2 26  GLUCOSE 133*  BUN 19  CREATININE 1.18  CALCIUM 8.7*   No results for input(s): LABPT, INR in the last 72 hours. No results for input(s): LABURIN in the last 72 hours. No results found for this or any previous visit.  Studies/Results: No results found.  Assessment/Plan:  Postoperative day 1 left radical nephrectomy.  He seems to be doing well.  Laboratories all look good.  I will advance his diet.  We will remove his catheter.  Continue to ambulate.  Reassessment early this afternoon for possible discharge.   LOS: 1 day   Jorja Loa 12/17/2021, 7:51 AM

## 2021-12-19 LAB — SURGICAL PATHOLOGY

## 2022-02-09 ENCOUNTER — Other Ambulatory Visit: Payer: Self-pay | Admitting: Internal Medicine

## 2022-02-09 DIAGNOSIS — R911 Solitary pulmonary nodule: Secondary | ICD-10-CM

## 2022-02-09 DIAGNOSIS — E010 Iodine-deficiency related diffuse (endemic) goiter: Secondary | ICD-10-CM

## 2022-02-15 ENCOUNTER — Ambulatory Visit
Admission: RE | Admit: 2022-02-15 | Discharge: 2022-02-15 | Disposition: A | Discharge status: Home / Self Care | Payer: 59 | Source: Ambulatory Visit | Attending: Internal Medicine | Admitting: Internal Medicine

## 2022-02-15 DIAGNOSIS — E010 Iodine-deficiency related diffuse (endemic) goiter: Secondary | ICD-10-CM

## 2022-02-25 ENCOUNTER — Encounter (HOSPITAL_COMMUNITY): Payer: Self-pay

## 2022-02-25 ENCOUNTER — Emergency Department (HOSPITAL_COMMUNITY)
Admission: EM | Admit: 2022-02-25 | Discharge: 2022-02-26 | Disposition: A | Discharge status: Home / Self Care | Payer: 59 | Attending: Emergency Medicine | Admitting: Emergency Medicine

## 2022-02-25 ENCOUNTER — Other Ambulatory Visit: Payer: Self-pay

## 2022-02-25 DIAGNOSIS — N4830 Priapism, unspecified: Secondary | ICD-10-CM | POA: Diagnosis not present

## 2022-02-25 NOTE — ED Triage Notes (Signed)
First dose of papa/phento/prostaglandin tonight.   Supposed to take 0.52m but accidentally took 0.467m   Erection >6hrs. Pain.

## 2022-02-26 MED ORDER — PHENYLEPHRINE 200 MCG/ML FOR PRIAPISM / HYPOTENSION
500.0000 ug | INTRAMUSCULAR | Status: DC | PRN
  Filled 2022-02-26: qty 50

## 2022-02-26 NOTE — ED Provider Notes (Signed)
[facility] DEPT Provider Note   CSN: [id number] Arrival date & time: 02/25/22  2344     History {Add pertinent medical, surgical, social history, OB history to HPI:1} CC:  priapism   Ian [name] is a 56 y.o. male.  The history is provided by the patient and medical records.   56 year old male presenting to the ED with priapism.  Used pap/phento/prostaglandin injection tonight for the first time around 6PM.  He reports sexual intercourse for about 1 hour following this but has had continued erection.  He has been able to urinate.  Starting to have increased pain.    Home Medications Prior to Admission medications   Medication Sig Start Date End Date Taking? Authorizing Provider  docusate sodium (COLACE) 100 MG capsule Take 1 capsule (100 mg total) by mouth 2 (two) times daily. 12/16/21   Debbrah Alar, PA-C  fexofenadine (ALLEGRA) 180 MG tablet Take 180 mg by mouth daily as needed for allergies or rhinitis.    [provider]  fluticasone (FLONASE) 50 MCG/ACT nasal spray Place 1 spray into both nostrils daily.    [provider]  HYDROcodone-acetaminophen (NORCO) 5-325 MG tablet Take 1-2 tablets by mouth every 6 (six) hours as needed for moderate pain or severe pain. 12/16/21   Debbrah Alar, PA-C  omeprazole (PRILOSEC) 20 MG capsule Take 20 mg by mouth daily. 09/15/21   [provider]  tadalafil (CIALIS) 20 MG tablet Take 20 mg by mouth daily as needed for erectile dysfunction.    [provider]      Allergies    Testosterone cypionate    Review of Systems   Review of Systems  Genitourinary:  Positive for penile pain.  All other systems reviewed and are negative.   Physical Exam Updated Vital Signs BP (!) 157/99   Pulse 76   Temp 98.1 F (36.7 C)   Resp 18   Ht '5\' 11"'$  (1.803 m)   Wt 86.2 kg   SpO2 98%   BMI 26.50 kg/m   Physical Exam Vitals and nursing note reviewed.  Constitutional:       Appearance: He is well-developed.  HENT:     Head: Normocephalic and atraumatic.  Eyes:     Conjunctiva/sclera: Conjunctivae normal.     Pupils: Pupils are equal, round, and reactive to light.  Cardiovascular:     Rate and Rhythm: Normal rate and regular rhythm.     Heart sounds: Normal heart sounds.  Pulmonary:     Effort: Pulmonary effort is normal.     Breath sounds: Normal breath sounds.  Abdominal:     General: Bowel sounds are normal.     Palpations: Abdomen is soft.  Genitourinary:    Comments: Wife present at bedside as chaperone, priapism present Musculoskeletal:        General: Normal range of motion.     Cervical back: Normal range of motion.  Skin:    General: Skin is warm and dry.  Neurological:     Mental Status: He is alert and oriented to person, place, and time.     ED Results / Procedures / Treatments   Labs (all labs ordered are listed, but only abnormal results are displayed) Labs Reviewed - No data to display  EKG None  Radiology No results found.  Procedures Procedures  {Document cardiac monitor, telemetry assessment procedure when appropriate:1}  Medications Ordered in ED Medications  phenylephrine 200 mcg / ml CONC. DILUTION INJ (ED / Urology USE ONLY) (  has no administration in time range)    ED Course/ Medical Decision Making/ A&P                           Medical Decision Making  ***  {Document critical care time when appropriate:1} {Document review of labs and clinical decision tools ie heart score, Chads2Vasc2 etc:1}  {Document your independent review of radiology images, and any outside records:1} {Document your discussion with family members, caretakers, and with consultants:1} {Document social determinants of health affecting pt's care:1} {Document your decision making why or why not admission, treatments were needed:1} Final Clinical Impression(s) / ED Diagnoses Final diagnoses:  None    Rx / DC Orders ED Discharge  Orders     None

## 2022-03-02 ENCOUNTER — Ambulatory Visit
Admission: RE | Admit: 2022-03-02 | Discharge: 2022-03-02 | Disposition: A | Discharge status: Home / Self Care | Payer: 59 | Source: Ambulatory Visit | Attending: Internal Medicine | Admitting: Internal Medicine

## 2022-03-02 DIAGNOSIS — R911 Solitary pulmonary nodule: Secondary | ICD-10-CM

## 2022-11-20 IMAGING — MR MR ABDOMEN WO/W CM
12 of 19 series · 26 of 48 positions shown · IV contrast (multihance)
Comparison: MRI abdomen 09/24/2021

CLINICAL DATA: Renal mass follow-up

EXAM:
MRI ABDOMEN WITHOUT AND WITH CONTRAST
TECHNIQUE: Multiplanar multisequence MR imaging of the abdomen was performed
both before and after the administration of intravenous contrast.
CONTRAST:  17mL MULTIHANCE GADOBENATE DIMEGLUMINE 529 MG/ML IV SOLN

[Series 3: cor haste · coronal · 5.0mm · 0.68mm/px · 2 of 34 slices shown]
[im 1/34]
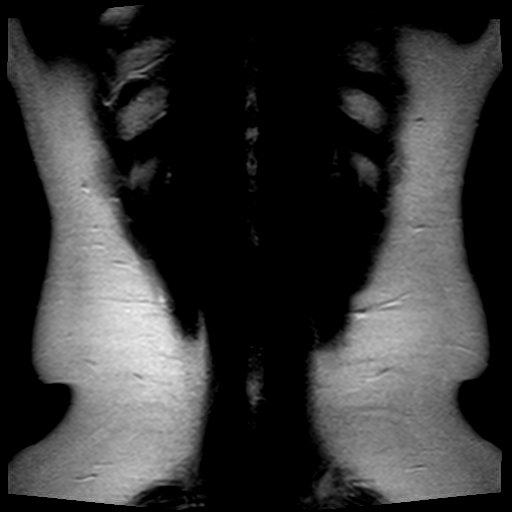
[im 34/34]
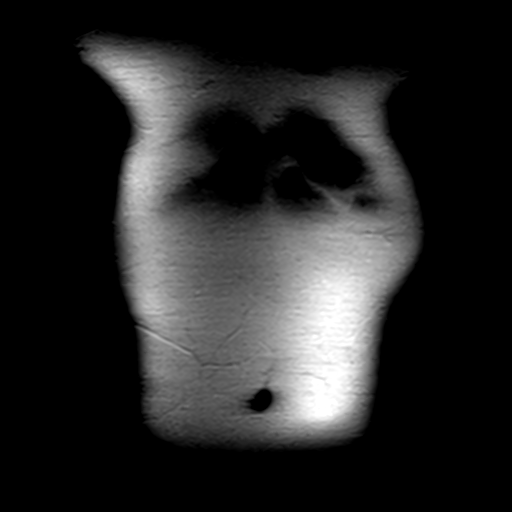

[Series 4: axial haste · axial · 6.0mm · 0.68mm/px · z∈[-106,+138]mm · 2 of 38 slices shown]
[im 1/38]
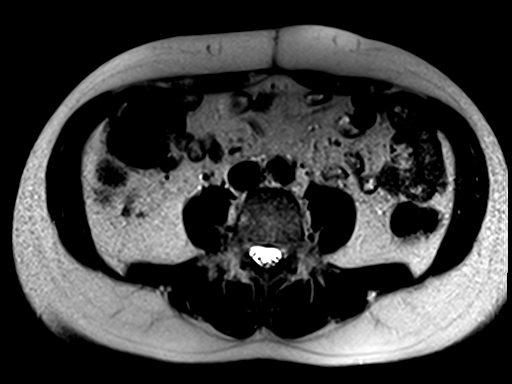
[im 38/38]
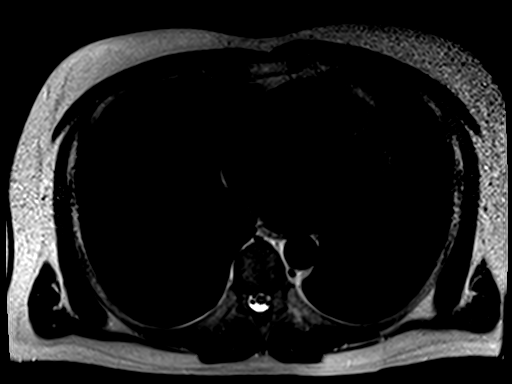

[Series 5: T1 · axial · 6.0mm · 0.68mm/px · z∈[-105,+139]mm · 2 of 76 slices shown]
[im 1/76]
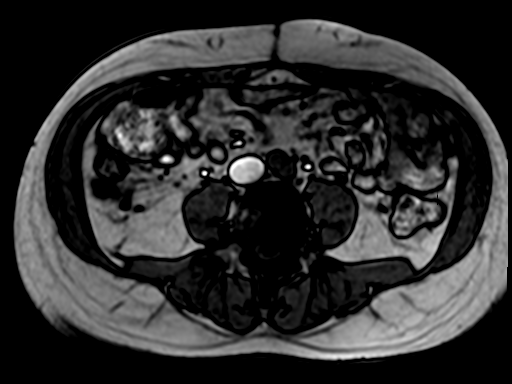
[im 76/76]
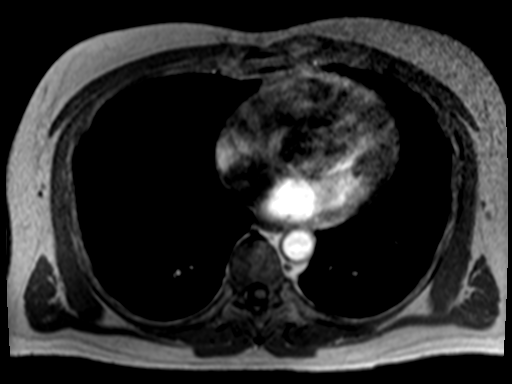

[Series 6: bSSFP · axial · 4.0mm · 0.68mm/px · z∈[-107,+141]mm · 2 of 63 slices shown]
[im 1/63]
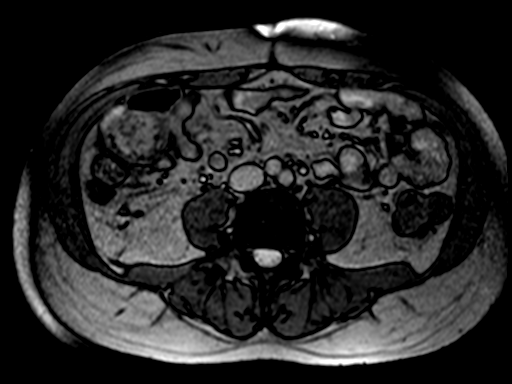
[im 63/63]
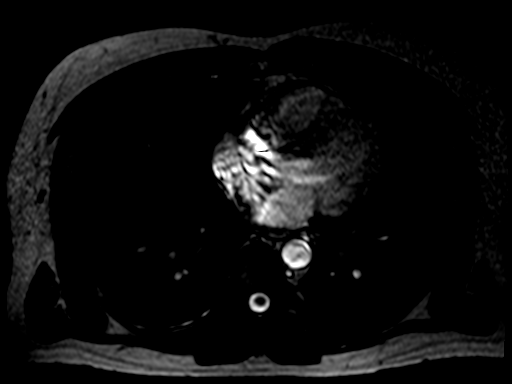

[Series 7: T2 fat-sat · axial · 6.0mm · 1.09mm/px · 1 of 38 slices shown]
[im 1/38]
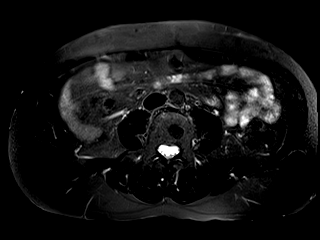

[Series 8: ep2d_diff_b50_500_800_p2_trig · axial · 6.0mm · 1.82mm/px · z∈[-93,+166]mm · 3 of 111 slices shown]
[im 1/111]
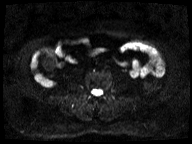
[im 56/111]
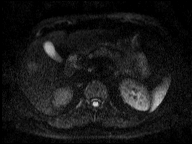
[im 111/111]
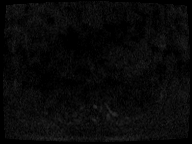

[Series 9: ep2d_diff_b50_500_800_p2_trig_adc · axial · 6.0mm · 1.82mm/px · 1 of 37 slices shown]
[im 1/37]
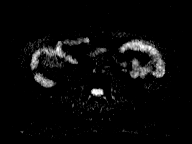

[Series 10: T1 dynamic · axial · non-contrast · 2.5mm · 0.74mm/px · z∈[-88,+169]mm · 3 of 104 slices shown]
[im 1/104]
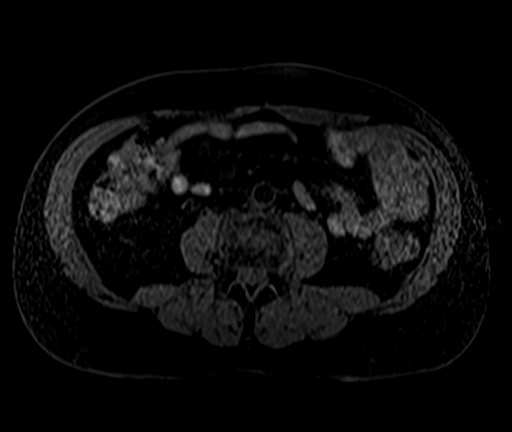
[im 52/104]
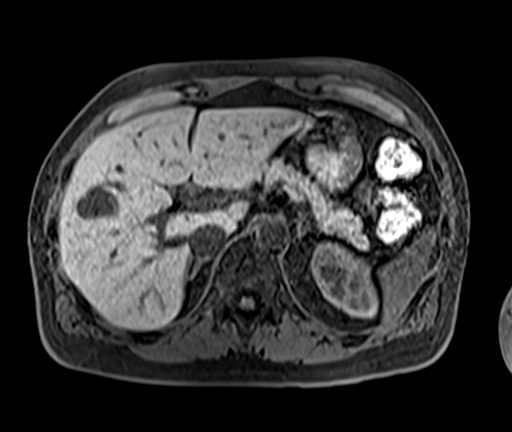
[im 104/104]
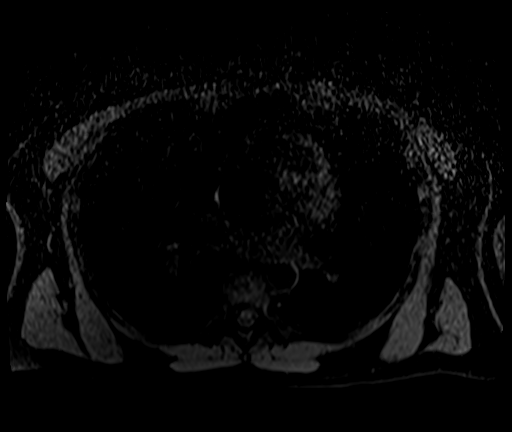

[Series 11: T1 dynamic post-contrast · axial · 2.5mm · 0.74mm/px · z∈[-88,+169]mm · 3 of 104 slices shown (1 of 4)]
[im 1/104]
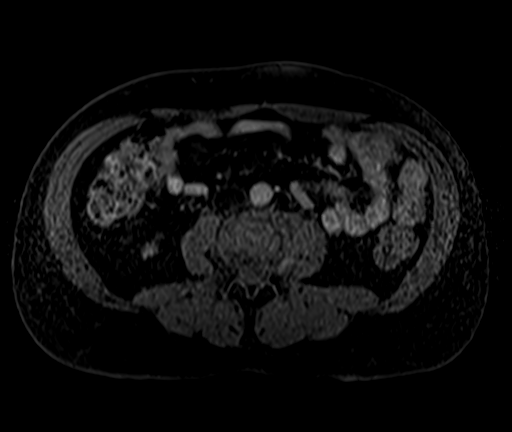
[im 52/104]
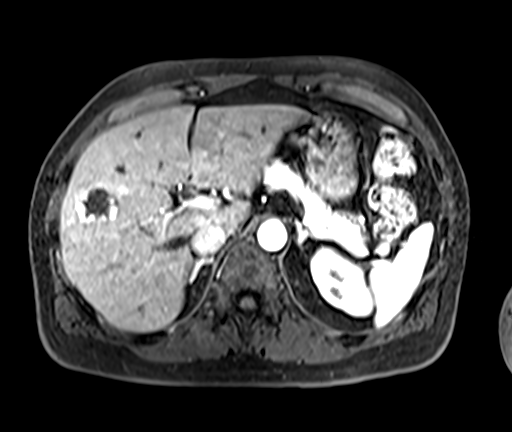
[im 104/104]
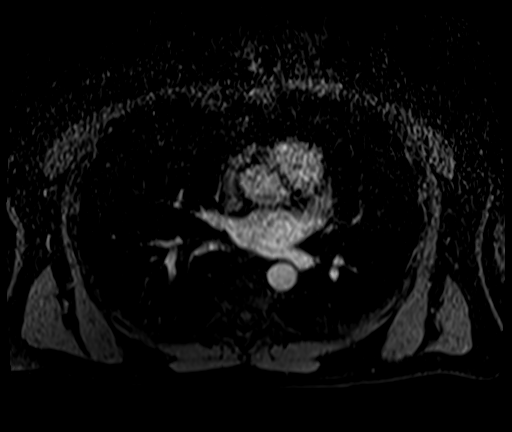

[Series 12: T1 dynamic post-contrast · axial · 2.5mm · 0.74mm/px · z∈[-88,+169]mm · 3 of 104 slices shown (2 of 4)]
[im 1/104]
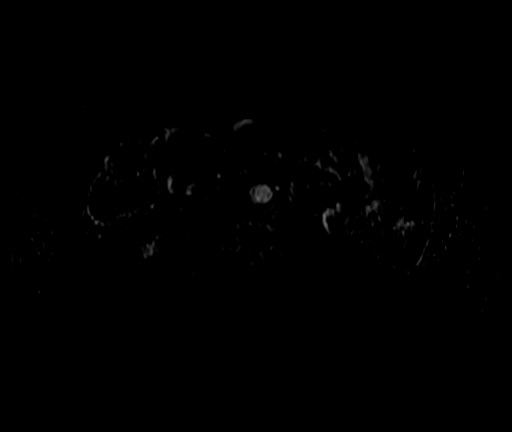
[im 52/104]
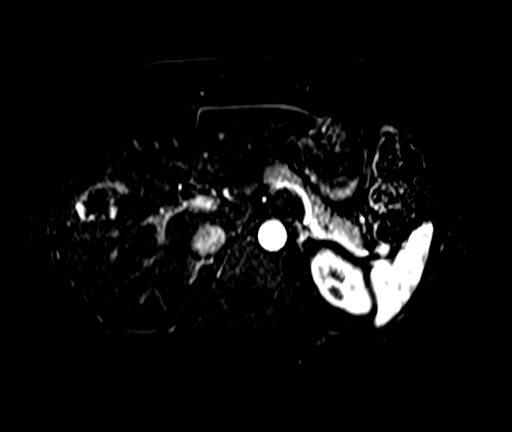
[im 104/104]
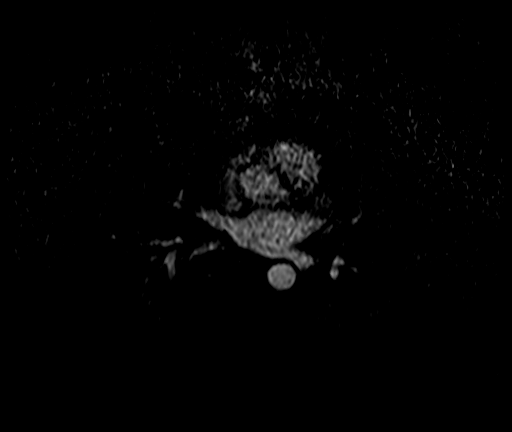

[Series 13: T1 dynamic post-contrast · axial · 2.5mm · 0.74mm/px · z∈[-88,+169]mm · 3 of 104 slices shown (3 of 4)]
[im 1/104]
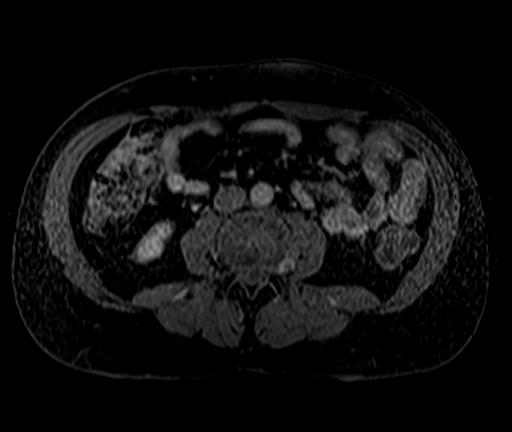
[im 52/104]
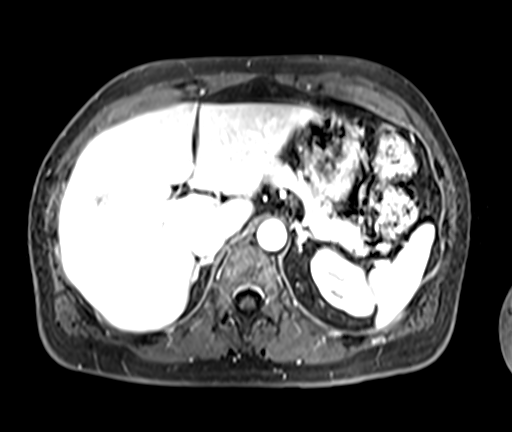
[im 104/104]
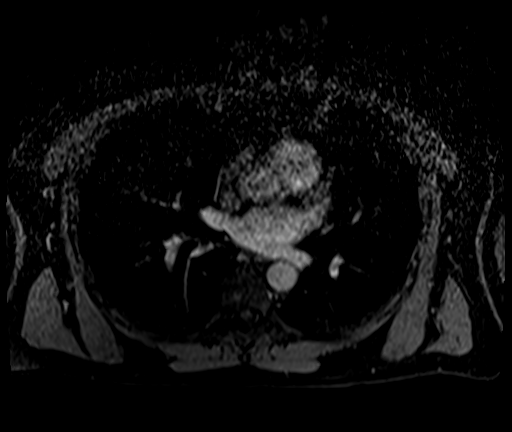

[Series 14: T1 dynamic post-contrast · axial · 2.5mm · 0.74mm/px · 1 of 104 slices shown (4 of 4)]
[im 1/104]
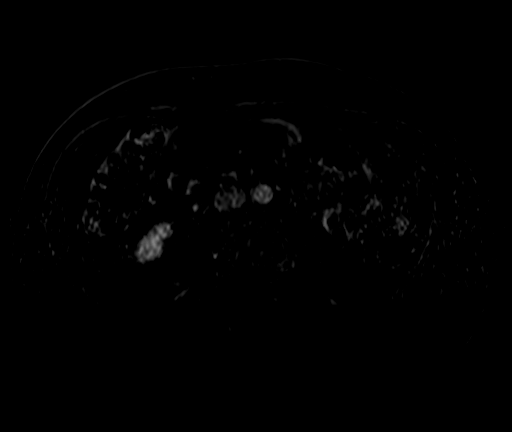

[26 of 48 positions shown; findings below may reference images not displayed]

FINDINGS: Lower chest: No acute findings.

Hepatobiliary: Liver is normal in size and contour with no
suspicious mass identified. Multiple hemangiomas are again seen
measuring up to 3.4 cm in the right lobe and 4 cm at the caudate
lobe. Gallbladder appears normal. No biliary ductal dilatation.

Pancreas: No mass, inflammatory changes, or other parenchymal
abnormality identified.

Spleen:  Within normal limits in size and appearance.

Adrenals/Urinary Tract: Adrenal glands appear normal.
Redemonstration of a heterogeneously enhancing mass in the medial
aspect of the mid left kidney which is slightly enlarged since
previous study measuring 3.8 x 3.4 cm in axial dimensions, with
otherwise stable appearance. There again appears to be involvement
of the adjacent proximal renal vein at the anterior margin of the
mass, not significantly changed. No additional renal mass identified
bilaterally. No hydronephrosis.

Stomach/Bowel: Visualized portions within the abdomen are
unremarkable.

Vascular/Lymphatic: No pathologically enlarged lymph nodes
identified. No abdominal aortic aneurysm demonstrated.

Other:  No ascites.

Musculoskeletal: No suspicious bone lesions identified.
IMPRESSION: 1. Mildly increased size of the heterogeneously enhancing mass in
the left kidney since previous study, again likely renal cell
carcinoma. Stable involvement with the adjacent left renal vein at
the anterior margin of the mass.
2. No lymphadenopathy.
3. Multiple hepatic hemangiomas.

## 2023-02-13 ENCOUNTER — Other Ambulatory Visit: Payer: Self-pay | Admitting: Internal Medicine

## 2023-02-13 DIAGNOSIS — R911 Solitary pulmonary nodule: Secondary | ICD-10-CM

## 2023-03-22 ENCOUNTER — Ambulatory Visit
Admission: RE | Admit: 2023-03-22 | Discharge: 2023-03-22 | Disposition: A | Discharge status: Home / Self Care | Payer: 59 | Source: Ambulatory Visit | Attending: Internal Medicine | Admitting: Internal Medicine

## 2023-03-22 DIAGNOSIS — R911 Solitary pulmonary nodule: Secondary | ICD-10-CM

## 2024-02-25 ENCOUNTER — Encounter: Payer: Self-pay | Admitting: Podiatry

## 2024-02-25 ENCOUNTER — Ambulatory Visit (INDEPENDENT_AMBULATORY_CARE_PROVIDER_SITE_OTHER): Admitting: Podiatry

## 2024-02-25 ENCOUNTER — Ambulatory Visit

## 2024-02-25 VITALS — Ht 71.0 in | Wt 190.0 lb

## 2024-02-25 DIAGNOSIS — M7752 Other enthesopathy of left foot: Secondary | ICD-10-CM

## 2024-02-25 DIAGNOSIS — R202 Paresthesia of skin: Secondary | ICD-10-CM | POA: Diagnosis not present

## 2024-03-03 NOTE — Progress Notes (Signed)
   Chief Complaint  Patient presents with   Foot Pain    Pt is here due to left foot , he states that when walking, exercising or doing lawn care after 30 minutes his foot feels like its asleep, this has been going on for about 9 months, he has had a prior soft tissue issue in the foot, thinks that may have something to do with it.    HPI: 58 y.o. male presenting today for above complaint  Past Medical History:  Diagnosis Date   Androgen deficiency    low T   GERD (gastroesophageal reflux disease)    HLD (hyperlipidemia)     Past Surgical History:  Procedure Laterality Date   ROBOT ASSISTED LAPAROSCOPIC NEPHRECTOMY Left 12/16/2021   Procedure: XI ROBOTIC ASSISTED LAPAROSCOPIC NEPHRECTOMY;  Surgeon: Selma Donnice SAUNDERS, MD;  Location: [facility];  Service: Urology;  Laterality: Left;  ONLY NEEDS 180 MIN   UPPER GI ENDOSCOPY  2008    Allergies  Allergen Reactions   Testosterone Cypionate Swelling and Other (See Comments)    Injection - Swelling at site      Physical Exam: General: The patient is alert and oriented x3 in no acute distress.  Dermatology: Skin is warm, dry and supple bilateral lower extremities.   Vascular: Palpable pedal pulses bilaterally. Capillary refill within normal limits.  No appreciable edema.  No erythema.  Neurological: Grossly intact via light touch  Musculoskeletal Exam: No pedal deformities noted   Assessment/Plan of Care: 1.  Intermittent paresthesia secondary to activity  -Patient evaluated.  -Recommend wide fitting shoes that do not irritate or constrict the toebox area -Recommend OTC arch supports to offload pressure from the forefoot -Reassured the patient that his symptoms are likely secondary to activity  -Return to clinic PRN       Thresa EMERSON Sar, DPM [facility]  Dr. Thresa EMERSON Sar, DPM    [address] [place], [place] [address]                Office [phone]  Fax [phone]

## 2024-04-08 ENCOUNTER — Other Ambulatory Visit: Payer: Self-pay | Admitting: Urology

## 2024-04-08 DIAGNOSIS — D49512 Neoplasm of unspecified behavior of left kidney: Secondary | ICD-10-CM

## 2024-04-08 DIAGNOSIS — R911 Solitary pulmonary nodule: Secondary | ICD-10-CM

## 2024-04-08 NOTE — Progress Notes (Unsigned)
 Ian [name] LABOR, MD  Ian [name] No biopsy to be performed.  Nodule likely benign and is too small to target.       Previous Messages    ----- Message ----- From: Ian [name] Sent: 04/08/2024   9:49 AM EDT To: [name] Ian; Taryn F Rigney, RT; Ir Proc* Subject: CT LUNG MASS BIOPSY                            Procedure :CT LUNG MASS BIOPSY  Reason :Solitary Pulmonary Nodule, Neoplasm of left kidney Dx: Neoplasm of left kidney [I50.487 (ICD-10-CM)]; Solitary pulmonary nodule [R91.1 (ICD-10-CM)]    History : There is a CT CHEST-ABD-PEL WO/W in PACS from Sept 15,2025 Accession Number [id number] ALUS     Patient ID: [id number]  Provider:  Selma Donnice SAUNDERS, MD  Provider contact ;  [phone]

## 2024-04-18 ENCOUNTER — Other Ambulatory Visit: Payer: Self-pay

## 2024-04-18 DIAGNOSIS — R918 Other nonspecific abnormal finding of lung field: Secondary | ICD-10-CM

## 2024-04-18 DIAGNOSIS — N2889 Other specified disorders of kidney and ureter: Secondary | ICD-10-CM

## 2024-04-21 ENCOUNTER — Encounter (HOSPITAL_BASED_OUTPATIENT_CLINIC_OR_DEPARTMENT_OTHER): Payer: Self-pay | Admitting: Urology

## 2024-04-21 ENCOUNTER — Inpatient Hospital Stay: Attending: Internal Medicine | Admitting: Internal Medicine

## 2024-04-21 ENCOUNTER — Inpatient Hospital Stay

## 2024-04-21 VITALS — BP 130/86 | HR 63 | Temp 97.6°F | Resp 17 | Ht 71.0 in | Wt 197.4 lb

## 2024-04-21 DIAGNOSIS — R918 Other nonspecific abnormal finding of lung field: Secondary | ICD-10-CM | POA: Insufficient documentation

## 2024-04-21 DIAGNOSIS — Z905 Acquired absence of kidney: Secondary | ICD-10-CM | POA: Diagnosis not present

## 2024-04-21 DIAGNOSIS — Z85528 Personal history of other malignant neoplasm of kidney: Secondary | ICD-10-CM | POA: Insufficient documentation

## 2024-04-21 DIAGNOSIS — Z808 Family history of malignant neoplasm of other organs or systems: Secondary | ICD-10-CM | POA: Diagnosis not present

## 2024-04-21 DIAGNOSIS — N2889 Other specified disorders of kidney and ureter: Secondary | ICD-10-CM

## 2024-04-21 DIAGNOSIS — C642 Malignant neoplasm of left kidney, except renal pelvis: Secondary | ICD-10-CM | POA: Insufficient documentation

## 2024-04-21 LAB — CBC WITH DIFFERENTIAL (CANCER CENTER ONLY)
Abs Immature Granulocytes: 0.01 K/uL (ref 0.00–0.07)
Basophils Absolute: 0.1 K/uL (ref 0.0–0.1)
Basophils Relative: 1 %
Eosinophils Absolute: 0.2 K/uL (ref 0.0–0.5)
Eosinophils Relative: 3 %
HCT: 34.1 % — ABNORMAL LOW (ref 39.0–52.0)
Hemoglobin: 12.1 g/dL — ABNORMAL LOW (ref 13.0–17.0)
Immature Granulocytes: 0 %
Lymphocytes Relative: 45 %
Lymphs Abs: 2.2 K/uL (ref 0.7–4.0)
MCH: 34.1 pg — ABNORMAL HIGH (ref 26.0–34.0)
MCHC: 35.5 g/dL (ref 30.0–36.0)
MCV: 96.1 fL (ref 80.0–100.0)
Monocytes Absolute: 0.6 K/uL (ref 0.1–1.0)
Monocytes Relative: 12 %
Neutro Abs: 2 K/uL (ref 1.7–7.7)
Neutrophils Relative %: 39 %
Platelet Count: 248 K/uL (ref 150–400)
RBC: 3.55 MIL/uL — ABNORMAL LOW (ref 4.22–5.81)
RDW: 12.3 % (ref 11.5–15.5)
WBC Count: 5 K/uL (ref 4.0–10.5)
nRBC: 0 % (ref 0.0–0.2)

## 2024-04-21 LAB — CMP (CANCER CENTER ONLY)
ALT: 19 U/L (ref 0–44)
AST: 23 U/L (ref 15–41)
Albumin: 4.6 g/dL (ref 3.5–5.0)
Alkaline Phosphatase: 66 U/L (ref 38–126)
Anion gap: 7 (ref 5–15)
BUN: 29 mg/dL — ABNORMAL HIGH (ref 6–20)
CO2: 30 mmol/L (ref 22–32)
Calcium: 10.3 mg/dL (ref 8.9–10.3)
Chloride: 99 mmol/L (ref 98–111)
Creatinine: 1.29 mg/dL — ABNORMAL HIGH (ref 0.61–1.24)
GFR, Estimated: >60 mL/min (ref >60)
Glucose, Bld: 96 mg/dL (ref 70–99)
Potassium: 4.6 mmol/L (ref 3.5–5.1)
Sodium: 136 mmol/L (ref 135–145)
Total Bilirubin: 0.8 mg/dL (ref 0.0–1.2)
Total Protein: 7.9 g/dL (ref 6.5–8.1)

## 2024-04-21 NOTE — Progress Notes (Signed)
 [facility] [phone]   [phone]  CONSULT NOTE  REFERRING PHYSICIAN: Dr. Donnice Siad  REASON FOR CONSULTATION:  58 years old white male with pulmonary nodule  HPI Ian [name] is a 58 y.o. male.   HPI  Discussed the use of AI scribe software for clinical note transcription with the patient, who gave verbal consent to proceed.  History of Present Illness Ian [name] is a 58 year old male with stage one left renal cell carcinoma who presents with a newly discovered pulmonary nodule. He was referred by Dr. Andree for evaluation of a pulmonary nodule.  He has a history of stage one left renal cell carcinoma, diagnosed after an incidental finding during a cardiac scoring test in June 2022. A CT scan in January 2023 revealed a suspicious liver lesion, later identified as a hemangioma via MRI in March 2023. The MRI also detected an enhancing mass in the left kidney, measuring 3.5 by 3.0 cm. He underwent a left nephrectomy on December 16, 2021, and pathology showed chromophobe renal cell carcinoma, stage one.  He has been under surveillance for a stable lung nodule initially detected during the cardiac scoring test. Follow-up scans in 2023 and 2024 showed stability. However, a recent scan on March 31, 2024, identified a new solid nodule in the left lower lobe measuring 2.0 by 1.5 cm, alongside the previously stable nodules in the left lower lobe (0.7 cm) and right middle lobe (0.3 cm). No chest pain, breathing issues, or hemoptysis, but he has experienced a lingering cough following two recent head colds, one of which included a sinus infection and conjunctivitis, treated successfully with antibiotics.  He has a history of acid reflux and high cholesterol, for which he started a statin two months ago. He also has hypertension, managed with medication after trying two different drugs. He denies any history of diabetes, heart attacks, or strokes.  Family  history includes melanoma in his mother and non-Hodgkin lymphoma in his father. His grandfather had prostate cancer at 49 years old.  Socially, he works as a Therapist, music and has a history of occasional cigar smoking, less than ten per year, and social alcohol consumption, approximately a dozen drinks per week. He denies any illicit drug use. He has no known drug allergies but had a poor reaction to a testosterone shot 15 years ago.     Past Medical History:  Diagnosis Date   Androgen deficiency    low T   GERD (gastroesophageal reflux disease)    HLD (hyperlipidemia)       Past Surgical History:  Procedure Laterality Date   ROBOT ASSISTED LAPAROSCOPIC NEPHRECTOMY Left 12/16/2021   Procedure: XI ROBOTIC ASSISTED LAPAROSCOPIC NEPHRECTOMY;  Surgeon: Siad Donnice SAUNDERS, MD;  Location: [facility];  Service: Urology;  Laterality: Left;  ONLY NEEDS 180 MIN   UPPER GI ENDOSCOPY  2008    Family History  Problem Relation Age of Onset   Melanoma Mother    Non-Hodgkin's lymphoma Father     Social History Social History   Tobacco Use   Smoking status: Never   Smokeless tobacco: Never  Vaping Use   Vaping status: Never Used  Substance Use Topics   Alcohol use: Yes    Alcohol/week: 14.0 standard drinks of alcohol    Types: 14 Standard drinks or equivalent per week   Drug use: Never    Allergies  Allergen Reactions   Testosterone Cypionate Swelling and Other (See Comments)    Injection -  Swelling at site     Current Outpatient Medications  Medication Sig Dispense Refill   docusate sodium  (COLACE) 100 MG capsule Take 1 capsule (100 mg total) by mouth 2 (two) times daily.     fexofenadine (ALLEGRA) 180 MG tablet Take 180 mg by mouth daily as needed for allergies or rhinitis.     fluticasone (FLONASE) 50 MCG/ACT nasal spray Place 1 spray into both nostrils daily.     HYDROcodone -acetaminophen  (NORCO) 5-325 MG tablet Take 1-2 tablets by mouth every 6 (six) hours as needed for moderate  pain or severe pain. 20 tablet 0   omeprazole (PRILOSEC) 20 MG capsule Take 20 mg by mouth daily.     PAPAVERINE HCL IJ Inject 0.2 mLs as directed as needed (erectile dysfunction).     tadalafil (CIALIS) 20 MG tablet Take 20 mg by mouth daily as needed for erectile dysfunction.     No current facility-administered medications for this visit.    Review of Systems  Constitutional: negative Eyes: negative Ears, nose, mouth, throat, and face: negative Respiratory: negative Cardiovascular: negative Gastrointestinal: negative Genitourinary:negative Integument/breast: negative Hematologic/lymphatic: negative Musculoskeletal:negative Neurological: negative Behavioral/Psych: negative Endocrine: negative Allergic/Immunologic: negative  Physical Exam  MJO:jozmu, healthy, no distress, well nourished, and well developed SKIN: skin color, texture, turgor are normal, no rashes or significant lesions HEAD: Normocephalic, No masses, lesions, tenderness or abnormalities EYES: normal, PERRLA, Conjunctiva are pink and non-injected EARS: External ears normal, Canals clear OROPHARYNX:no exudate, no erythema, and lips, buccal mucosa, and tongue normal  NECK: supple, no adenopathy, no JVD LYMPH:  no palpable lymphadenopathy, no hepatosplenomegaly LUNGS: clear to auscultation , and palpation HEART: regular rate & rhythm, no murmurs, and no gallops ABDOMEN:abdomen soft, non-tender, normal bowel sounds, and no masses or organomegaly BACK: Back symmetric, no curvature., No CVA tenderness EXTREMITIES:no joint deformities, effusion, or inflammation, no edema  NEURO: alert & oriented x 3 with fluent speech, no focal motor/sensory deficits  PERFORMANCE STATUS: ECOG 0  LABORATORY DATA: Lab Results  Component Value Date   WBC 5.0 04/21/2024   HGB 12.1 (L) 04/21/2024   HCT 34.1 (L) 04/21/2024   MCV 96.1 04/21/2024   PLT 248 04/21/2024      Chemistry      Component Value Date/Time   NA 133 (L)  12/17/2021 0539   K 3.7 12/17/2021 0539   CL 99 12/17/2021 0539   CO2 26 12/17/2021 0539   BUN 19 12/17/2021 0539   CREATININE 1.18 12/17/2021 0539   CREATININE 0.90 10/12/2021 1422      Component Value Date/Time   CALCIUM 8.7 (L) 12/17/2021 0539   ALKPHOS 74 10/12/2021 1422   AST 23 10/12/2021 1422   ALT 25 10/12/2021 1422   BILITOT 0.7 10/12/2021 1422       RADIOGRAPHIC STUDIES: No results found.  ASSESSMENT AND PLAN: Assessment and Plan Assessment & Plan Pulmonary nodule, left lower lobe, suspicious for malignancy A 2.0 x 1.5 cm solid nodule in the left lower lobe, identified on CT scan dated March 31, 2024, is concerning for malignancy. Differential diagnosis includes metastatic disease from renal cell carcinoma, primary lung cancer, or an inflammatory process. Recent upper respiratory infections could contribute to the nodule's appearance, but its solitary nature warrants further investigation. - Order PET scan to assess metabolic activity of the nodule and rule out other areas of concern. - Coordinate with [facility] for potential biopsy based on PET scan results. - If PET scan is positive, consider bronchoscopy with navigation or percutaneous needle biopsy  for tissue diagnosis. - If PET scan is negative, consider monitoring or biopsy to confirm diagnosis.  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 Tumor was approximately 4 cm, with no adjuvant treatment required. Surveillance imaging ongoing due to a new nodule in the left lower lobe. - Monitor for any new symptoms or changes in health status. He was advised to call immediately if he has any concerning symptoms in the interval.   The patient voices understanding of current disease status and treatment options and is in agreement with the current care plan.  All questions were answered. The patient knows  to call the clinic with any problems, questions or concerns. We can certainly see the patient much sooner if necessary.  Thank you so much for allowing me to participate in the care of Ian [name]. I will continue to follow up the patient with you and assist in his care.   Disclaimer: This note was dictated with voice recognition software. Similar sounding words can inadvertently be transcribed and may not be corrected upon review.   Sherrod MARLA Sherrod April 21, 2024, 11:32 AM

## 2024-04-22 ENCOUNTER — Ambulatory Visit (INDEPENDENT_AMBULATORY_CARE_PROVIDER_SITE_OTHER): Admitting: Pulmonary Disease

## 2024-04-22 ENCOUNTER — Telehealth (HOSPITAL_BASED_OUTPATIENT_CLINIC_OR_DEPARTMENT_OTHER): Payer: Self-pay | Admitting: Pulmonary Disease

## 2024-04-22 ENCOUNTER — Encounter (HOSPITAL_BASED_OUTPATIENT_CLINIC_OR_DEPARTMENT_OTHER): Payer: Self-pay | Admitting: Pulmonary Disease

## 2024-04-22 VITALS — BP 133/73 | HR 77 | Ht 71.0 in | Wt 197.5 lb

## 2024-04-22 DIAGNOSIS — R918 Other nonspecific abnormal finding of lung field: Secondary | ICD-10-CM

## 2024-04-22 DIAGNOSIS — Z905 Acquired absence of kidney: Secondary | ICD-10-CM | POA: Diagnosis not present

## 2024-04-22 DIAGNOSIS — C642 Malignant neoplasm of left kidney, except renal pelvis: Secondary | ICD-10-CM | POA: Diagnosis not present

## 2024-04-22 DIAGNOSIS — R911 Solitary pulmonary nodule: Secondary | ICD-10-CM | POA: Insufficient documentation

## 2024-04-22 NOTE — Progress Notes (Signed)
 Please schedule the following:  Provider performing procedure:Byrum/Dewald/Stephenia Vogan Diagnosis: Left Lower Lobe Lung Nodule Which side if for nodule / mass? Left Procedure: Ion navigation bronchoscopy  Has patient been spoken to by Provider and given informed consent? Yes Anesthesia: General Do you need Fluro? Yes Duration of procedure: 1.5 hours Date: 10/14 Alternate Date: 10/16 Alternate Date:  10/21 Alternate Date: 10/22 Time: Any Location: [facility] Does patient have OSA? No DM? No Or Latex allergy? No Medication Restriction/ Anticoagulate/Antiplatelet: N/A Pre-op Labs Ordered:determined by Anesthesia Imaging request: PET/CT and Super D CT scheduled for 10/10  (If, SuperDimension CT Chest, please have STAT courier sent to ENDO)

## 2024-04-22 NOTE — Progress Notes (Signed)
 New Patient Pulmonology Office Visit   Subjective:  Patient ID: Ian [name], male    DOB: 07-19-1965  MRN: [id number]  Referred by: Selma Donnice SAUNDERS, MD  CC:  Chief Complaint  Patient presents with   Consult    Pulmonary nodules    HPI Ian [name] is a 58 y.o. male with hx of RCC who presents for initial evaluation of solitary pulmonary nodule (LLL).  Discussed the use of AI scribe software for clinical note transcription with the patient, who gave verbal consent to proceed.  History of Present Illness Ian [name] is a 58 year old male with renal carcinoma status post left nephrectomy who presents for evaluation of a lung nodule. He was referred by his urologist for initial evaluation of lung nodule found on surveillance chest imaging.  A routine follow-up CT scan with contrast of the chest, abdomen, and pelvis performed 03/31/2024 revealed a nodule in the left lower lobe of the lung. The patient had a RML nodule previously observed and stagnant at approximately four millimeters.  He reports no chronic lung symptoms such as breathing problems or wheezing, but his wife noted he has been coughing for a month following a head cold. He describes the cough as productive and attributes it to the lingering effects of the cold. He has no regular smoking history, stating he has never smoked regularly, though he has had a cigar occasionally over the last ten years without inhaling.  His past medical history includes a nephrectomy performed at [facility], with clear margins reported post-surgery. He has not received chemotherapy or radiation therapy. The initial discovery of his kidney lesion was incidental during a cardiac CT scan three years ago, which led to further imaging and the eventual diagnosis of renal carcinoma.  Family history is significant for his mother having melanoma and his father having non-Hodgkin's lymphoma. There is no family history of lung  cancer.  Socially, he works as a Therapist, music at [employer] but is not frequently exposed to [employer]. He has good health insurance.  Symptoms Associated with Lung cancer: none  Hx of Anesthesia Reactions: none  PMH:  - RCC s/p nephrectomy in 2023  Important Medications: no blood thinners or diabetic medications  Allergies: testosterone  Social History:  Smoking and [employer] Exposure: Tobacco Smoking Cigars for 10 years  ASA grade:  ASA 2 - Patient with mild systemic disease with no functional limitations  Karnofsky Performance Status: 100 Normal; no complaints  ECOG Performance Status: (0) Fully active, able to carry on all predisease performance without restriction  ROS  Allergies: Testosterone cypionate  Current Outpatient Medications:    fexofenadine (ALLEGRA) 180 MG tablet, Take 180 mg by mouth daily as needed for allergies or rhinitis., Disp: , Rfl:    fluticasone (FLONASE) 50 MCG/ACT nasal spray, Place 1 spray into both nostrils daily., Disp: , Rfl:    olmesartan-hydrochlorothiazide (BENICAR HCT) 40-25 MG tablet, Take 1 tablet by mouth daily., Disp: , Rfl:    omeprazole (PRILOSEC) 20 MG capsule, Take 20 mg by mouth daily., Disp: , Rfl:    PAPAVERINE HCL IJ, Inject 0.2 mLs as directed as needed (erectile dysfunction)., Disp: , Rfl:    rosuvastatin (CRESTOR) 10 MG tablet, Take 10 mg by mouth at bedtime., Disp: , Rfl:    docusate sodium  (COLACE) 100 MG capsule, Take 1 capsule (100 mg total) by mouth 2 (two) times daily. (Patient not taking: Reported on 04/22/2024), Disp: , Rfl:  HYDROcodone -acetaminophen  (NORCO) 5-325 MG tablet, Take 1-2 tablets by mouth every 6 (six) hours as needed for moderate pain or severe pain. (Patient not taking: Reported on 04/22/2024), Disp: 20 tablet, Rfl: 0   tadalafil (CIALIS) 20 MG tablet, Take 20 mg by mouth daily as needed for erectile dysfunction. (Patient not taking: Reported on 04/22/2024), Disp: , Rfl:   Past Medical History:  Diagnosis Date   Androgen deficiency    low T   GERD (gastroesophageal reflux disease)    HLD (hyperlipidemia)    Past Surgical History:  Procedure Laterality Date   ROBOT ASSISTED LAPAROSCOPIC NEPHRECTOMY Left 12/16/2021   Procedure: XI ROBOTIC ASSISTED LAPAROSCOPIC NEPHRECTOMY;  Surgeon: Selma Donnice SAUNDERS, MD;  Location: [facility];  Service: Urology;  Laterality: Left;  ONLY NEEDS 180 MIN   UPPER GI ENDOSCOPY  2008   Family History  Problem Relation Age of Onset   Melanoma Mother    Non-Hodgkin's lymphoma Father    Social History   Socioeconomic History   Marital status: Married    Spouse name: Not on file   Number of children: Not on file   Years of education: Not on file   Highest education level: Not on file  Occupational History   Not on file  Tobacco Use   Smoking status: Never   Smokeless tobacco: Never  Vaping Use   Vaping status: Never Used  Substance and Sexual Activity   Alcohol use: Yes    Alcohol/week: 14.0 standard drinks of alcohol    Types: 14 Standard drinks or equivalent per week   Drug use: Never   Sexual activity: Not on file  Other Topics Concern   Not on file  Social History Narrative   Not on file   Social Drivers of Health   Financial Resource Strain: Not on file  Food Insecurity: No Food Insecurity (04/21/2024)   Hunger Vital Sign    Worried About Running Out of Food in the Last Year: Never true    Ran Out of Food in the Last Year: Never true  Transportation Needs: No Transportation Needs (04/21/2024)   PRAPARE - Administrator, Civil Service (Medical): No    Lack of Transportation (Non-Medical): No  Physical Activity: Not on file  Stress: Not on file  Social Connections: Not on file  Intimate Partner Violence: Not At Risk (04/21/2024)   Humiliation, Afraid, Rape, and Kick questionnaire    Fear of Current or Ex-Partner: No    Emotionally Abused: No    Physically Abused: No    Sexually Abused: No        Objective:  BP 133/73   Pulse 77   Ht 5' 11 (1.803 m)   Wt 197 lb 8 oz (89.6 kg)   SpO2 98%   BMI 27.55 kg/m  Wt Readings from Last 3 Encounters:  04/22/24 197 lb 8 oz (89.6 kg)  04/21/24 197 lb 6.4 oz (89.5 kg)  02/25/24 190 lb (86.2 kg)   BMI Readings from Last 3 Encounters:  04/22/24 27.55 kg/m  04/21/24 27.53 kg/m  02/25/24 26.50 kg/m   SpO2 Readings from Last 3 Encounters:  04/22/24 98%  04/21/24 97%  02/26/22 96%   Physical Exam General: NAD, alert, WD, WN Eyes: PERRL, no scleral icterus ENMT: oropharynx clear, good dentition, no oral lesions, mallampati score I Skin: warm, intact, no rashes Neck: JVD flat, ROM and lymph node assessment normal CV: RRR, no MRG, nl S1 and S2, no peripheral edema Resp: clear to auscultation  bilaterally, no wheezes, rales, or rhonchi, normal effort, no clubbing/cyanosis Abdom: Normoactive bowel sounds, soft, nontender, nondistended, no hepatosplenomegaly Neuro: Awake alert oriented to person place time and situation  Diagnostic Review:  Last CBC Lab Results  Component Value Date   WBC 5.0 04/21/2024   HGB 12.1 (L) 04/21/2024   HCT 34.1 (L) 04/21/2024   MCV 96.1 04/21/2024   MCH 34.1 (H) 04/21/2024   RDW 12.3 04/21/2024   PLT 248 04/21/2024   Last metabolic panel Lab Results  Component Value Date   GLUCOSE 96 04/21/2024   NA 136 04/21/2024   K 4.6 04/21/2024   CL 99 04/21/2024   CO2 30 04/21/2024   BUN 29 (H) 04/21/2024   CREATININE 1.29 (H) 04/21/2024   GFRNONAA >60 04/21/2024   CALCIUM 10.3 04/21/2024   PROT 7.9 04/21/2024   ALBUMIN 4.6 04/21/2024   BILITOT 0.8 04/21/2024   ALKPHOS 66 04/21/2024   AST 23 04/21/2024   ALT 19 04/21/2024   ANIONGAP 7 04/21/2024    I reviewed outside imaging on 03/31/2024 that shows a new LLL solitary pulmonary nodule (2 x 1.5 cm) with borderline mediastinal and hilar adenopathy. The nodule looks like a cannon ball met.  IMPRESSION: 1. New solid 2 x 1.5 cm left lower lobe  pulmonary nodule, highly suspicious for metastatic disease. 2. Stable 7 mm left lower lobe pulmonary nodule and 3 mm right middle lobe pulmonary nodule. 3. Status post left nephrectomy without evidence of local recurrence. 4. No evidence of metastatic disease in the abdomen or pelvis. 5. Fecalized loops of small bowel throughout the abdomen and pelvis, which can be seen in the setting of slow transit.   Pathology 12/2021: A. 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 Left lower lobe nodule likely metastatic renal cell carcinoma. Right middle lobe nodule stable. Differential includes metastatic renal cell carcinoma and less likely primary lung cancer. - Order PET scan to assess metabolic activity of nodules. - Order Super D CT scan concurrently with PET scan. - Schedule navigational robotic bronchoscopy for biopsy of left lower lobe nodule, pending PET scan results. - Discussed risks and benefits of procedure, including but not limited to pneumothorax, infection, and bleeding. - Perform pulmonary function test for surgical preparation.  Left renal cell carcinoma, status post nephrectomy, surveillance for recurrence or metastasis Left renal cell carcinoma post nephrectomy with clear margins. - Continue surveillance with imaging studies as directed by urology and oncology.  Orders Placed This Encounter  Procedures   Procedural/ Surgical Case Request: VIDEO BRONCHOSCOPY WITH ENDOBRONCHIAL NAVIGATION   NM PET SUPER D CT   Pulmonary function test   I spent 45 minutes reviewing patient's chart  including prior consultant notes, imaging, and PFTs as well as face-to-face with the patient, over half in discussion of the diagnosis and the importance of compliance with the treatment plan.  Return in about 4 weeks (around 05/20/2024).   Vesper Trant, MD

## 2024-04-22 NOTE — Patient Instructions (Signed)
-   Please get the CT chest done on Friday with the PET - I'll work on getting the biopsy scheduled - You will need to fast at midnight before the PET/CT and the biopsy - Come back in 4 weeks.

## 2024-04-22 NOTE — Progress Notes (Signed)
 This NN met with Ian [name] face to face at his consult with Dr Sherrod. [name] was accompanied by his wife, Ian [name]. Ian plan for Ian [name] is for him to have a consult with pulmonology to determine Ian next steps. Dr Selma on 09/30. NN was in contact with scheduler at [facility] who states she had reached out to Ian [name] but had to leave a VM. NN let Ian [name] know that Pulmonology has been reaching out to him, and per Ian scheduler, there are appts available this week at [facility]. [name] is amenable to going to [facility] for his pulmonology consult. NN escorted [name] and his wife to make his follow up appt in 2-3 weeks. NN provided direct contact information and encouraged [name] to reach out with any questions or concerns.  NN notified scheduler at [facility] that Ian [name] is willing to be seen there and he is expecting a call.

## 2024-04-24 ENCOUNTER — Encounter: Payer: Self-pay | Admitting: Pulmonary Disease

## 2024-04-24 NOTE — Telephone Encounter (Signed)
 Bronch scheduled by Sherlean will send to [facility] to check auth

## 2024-04-25 ENCOUNTER — Encounter (HOSPITAL_COMMUNITY)
Admission: RE | Admit: 2024-04-25 | Discharge: 2024-04-25 | Disposition: A | Discharge status: Home / Self Care | Source: Ambulatory Visit | Attending: Internal Medicine | Admitting: Internal Medicine

## 2024-04-25 ENCOUNTER — Encounter (HOSPITAL_COMMUNITY)
Admission: RE | Admit: 2024-04-25 | Discharge: 2024-04-25 | Disposition: A | Discharge status: Home / Self Care | Source: Ambulatory Visit | Attending: Pulmonary Disease | Admitting: Pulmonary Disease

## 2024-04-25 ENCOUNTER — Ambulatory Visit (HOSPITAL_BASED_OUTPATIENT_CLINIC_OR_DEPARTMENT_OTHER): Payer: Self-pay | Admitting: Pulmonary Disease

## 2024-04-25 DIAGNOSIS — R911 Solitary pulmonary nodule: Secondary | ICD-10-CM | POA: Insufficient documentation

## 2024-04-25 DIAGNOSIS — R918 Other nonspecific abnormal finding of lung field: Secondary | ICD-10-CM | POA: Diagnosis present

## 2024-04-25 LAB — GLUCOSE, CAPILLARY: Glucose-Capillary: 107 mg/dL — ABNORMAL HIGH (ref 70–99)

## 2024-04-25 MED ORDER — FLUDEOXYGLUCOSE F - 18 (FDG) INJECTION
10.0000 | Freq: Once | INTRAVENOUS | Status: AC
  Administered 2024-04-25: 9.84 via INTRAVENOUS

## 2024-04-28 NOTE — Telephone Encounter (Signed)
 Procedure was cancelled by the provider

## 2024-04-29 NOTE — Progress Notes (Signed)
 NN reached out to pt at this time to review PET scan results. Pt was informed that the suspicious nodule showed no activity on the PET scan. Pt informed that per Dr. Sherrod, pt's appts can be canceled, unless he would like to review the scans. Pt states he is fine with cancelling the appts. Pt informed that per Dr Sherrod, he should continue his regular follow ups with his urologist and keep his appts with pulmonology. Pt verbalized understanding and appreciates the advice. No further needs at this time.

## 2024-05-06 ENCOUNTER — Ambulatory Visit (HOSPITAL_COMMUNITY): Admit: 2024-05-06 | Admitting: Emergency Medicine

## 2024-05-06 DIAGNOSIS — R911 Solitary pulmonary nodule: Secondary | ICD-10-CM | POA: Insufficient documentation

## 2024-05-06 SURGERY — VIDEO BRONCHOSCOPY WITH ENDOBRONCHIAL NAVIGATION
Anesthesia: General | Laterality: Left

## 2024-05-07 ENCOUNTER — Encounter: Payer: Self-pay | Admitting: Internal Medicine

## 2024-05-08 NOTE — Telephone Encounter (Signed)
 Does pt still need upcoming F/U appt

## 2024-05-14 ENCOUNTER — Other Ambulatory Visit

## 2024-05-14 ENCOUNTER — Ambulatory Visit: Admitting: Internal Medicine

## 2024-05-21 ENCOUNTER — Encounter (HOSPITAL_BASED_OUTPATIENT_CLINIC_OR_DEPARTMENT_OTHER)

## 2024-05-21 ENCOUNTER — Ambulatory Visit (HOSPITAL_BASED_OUTPATIENT_CLINIC_OR_DEPARTMENT_OTHER): Admitting: Pulmonary Disease

## 2024-05-27 ENCOUNTER — Ambulatory Visit: Attending: Internal Medicine | Admitting: Internal Medicine

## 2024-05-27 ENCOUNTER — Encounter: Payer: Self-pay | Admitting: Internal Medicine

## 2024-05-27 VITALS — BP 120/80 | HR 60 | Ht 71.0 in | Wt 203.3 lb

## 2024-05-27 DIAGNOSIS — I10 Essential (primary) hypertension: Secondary | ICD-10-CM

## 2024-05-27 DIAGNOSIS — E785 Hyperlipidemia, unspecified: Secondary | ICD-10-CM

## 2024-05-27 DIAGNOSIS — R0683 Snoring: Secondary | ICD-10-CM | POA: Diagnosis not present

## 2024-05-27 NOTE — Progress Notes (Unsigned)
 Cardiology Office Note   Date: 05/27/2024  ID:  Ian [name], DOB 1966-06-10, MRN [id number]  PCP:  [name], MD  Cardiologist:   [name], MD   Pt referred for cardiac risk assessment     History of Present Illness: Ian [name] is a 58 y.o. male with a history of HTN, HL, renal cell CA and GERD  Followed by Dr Clarice  2023 Ca score CT   Score 0  This CT showed small lung nodule   THen in follow up for this was eventually found to have kidney Ca.  Underwent surgery   Early this year he was found to have high blood pressure     Tried on different meds   Now on olmesartan/hydrochlorothiazide  BP controlled In the summer he did have occasional episodes of dizziness while working in yard.  No syncope    The pt denies CP  Breathing is good   No palpitations   PT ahas been on Crestor 10 mg   Jx of CAD   (GM, Uncle) Does admit to snoring, notes some fatigue during day        Current Meds  Medication Sig   fexofenadine (ALLEGRA) 180 MG tablet Take 180 mg by mouth daily as needed for allergies or rhinitis.   fluticasone (FLONASE) 50 MCG/ACT nasal spray Place 1 spray into both nostrils daily. (Patient taking differently: Place 1 spray into both nostrils as needed.)   olmesartan-hydrochlorothiazide (BENICAR HCT) 40-25 MG tablet Take 1 tablet by mouth daily.   omeprazole (PRILOSEC) 20 MG capsule Take 20 mg by mouth daily.   PAPAVERINE HCL IJ Inject 0.2 mLs as directed as needed (erectile dysfunction).   rosuvastatin (CRESTOR) 10 MG tablet Take 10 mg by mouth at bedtime.     Allergies:   Testosterone cypionate   Past Medical History:  Diagnosis Date   Androgen deficiency    low T   GERD (gastroesophageal reflux disease)    HLD (hyperlipidemia)     Past Surgical History:  Procedure Laterality Date   ROBOT ASSISTED LAPAROSCOPIC NEPHRECTOMY Left 12/16/2021   Procedure: XI ROBOTIC ASSISTED LAPAROSCOPIC NEPHRECTOMY;  Surgeon: Selma Donnice SAUNDERS, MD;  Location: [facility];   Service: Urology;  Laterality: Left;  ONLY NEEDS 180 MIN   UPPER GI ENDOSCOPY  2008     Social History:  The patient  reports that he has never smoked. He has never used smokeless tobacco. He reports current alcohol use of about 14.0 standard drinks of alcohol per week. He reports that he does not use drugs.   Family History:  The patient's family history includes Melanoma in his mother; Non-Hodgkin's lymphoma in his father.    ROS:  Please see the history of present illness. All other systems are reviewed and  Negative to the above problem except as noted.    PHYSICAL EXAM: VS:  BP 120/80 (BP Location: Right Arm, Patient Position: Sitting, Cuff Size: Normal)   Pulse 60   Ht 5' 11 (1.803 m)   Wt 203 lb 4.8 oz (92.2 kg)   SpO2 98%   BMI 28.35 kg/m   GEN: Well nourished, well developed, in no acute distress  HEENT: normal  Neck: no JVD, carotid bruits, Cardiac: RRR; no murmurs,  Respiratory:  clear to auscultation bilaterally GI: soft, nontender, no hepatomegaly Ext  No LE edema    EKG:  EKG is ordered today.  NSR 60 bpm   Lipid Panel No results found for: CHOL, TRIG, HDL, CHOLHDL,  VLDL, LDLCALC, LDLDIRECT    Wt Readings from Last 3 Encounters:  05/27/24 203 lb 4.8 oz (92.2 kg)  04/22/24 197 lb 8 oz (89.6 kg)  04/21/24 197 lb 6.4 oz (89.5 kg)      ASSESSMENT AND PLAN:  1  HTN  BP increased over past year   He is now s/p nephrectomy Today, BP is controlled    With hx of snoring, some fatigue I would recomm Itamar sleep eval to exclude OSA    Keep on same meds for now   Watch, during hot days for hypotension.  2  HL  LIpid panel from Aug 2025 LDL was 193   HDL 91  PT placed on Crestor   Last LDL 85   HDL 77 Trig 153   (Sept 2025)  Need to get previous labs  Lpa from Feb 2025 was 59    Keep on current regimen.     Will set up for NMR panel in March    Keep on current regimen   3  Metabolics   Will check A1C    Pt eating a low carb diet (wife  cooks)  Further follow up based on test results   Current medicines are reviewed at length with the patient today.  The patient does not have concerns regarding medicines.  Signed, [name], MD

## 2024-05-27 NOTE — Patient Instructions (Signed)
 Medication Instructions:  Your physician recommends that you continue on your current medications as directed. Please refer to the Current Medication list given to you today.  *If you need a refill on your cardiac medications before your next appointment, please call your pharmacy*  Lab Work: TODAY: Hgb A1c End of March 2026: fasting lab (NMR) If you have labs (blood work) drawn today and your tests are completely normal, you will receive your results only by: MyChart Message (if you have MyChart) OR A paper copy in the mail If you have any lab test that is abnormal or we need to change your treatment, we will call you to review the results.  Testing/Procedures: SLEEP STUDY Your physician has recommended that you have a home sleep study. This test records several body functions during sleep, including: brain activity, eye movement, oxygen and carbon dioxide blood levels, heart rate and rhythm, breathing rate and rhythm, the flow of air through your mouth and nose, snoring, body muscle movements, and chest and belly movement.  Follow-Up: At [facility], you and your health needs are our priority.  As part of our continuing mission to provide you with exceptional heart care, our providers are all part of one team.  This team includes your primary Cardiologist (physician) and Advanced Practice Providers or APPs (Physician Assistants and Nurse Practitioners) who all work together to provide you with the care you need, when you need it.  Your next appointment:   As needed  Provider:   [name], MD   We recommend signing up for the patient portal called MyChart.  Sign up information is provided on this After Visit Summary.  MyChart is used to connect with patients for Virtual Visits (Telemedicine).  Patients are able to view lab/test results, encounter notes, upcoming appointments, etc.  Non-urgent messages can be sent to your provider as well.    To learn more about what you can  do with MyChart, go to [email or web address].

## 2024-05-28 ENCOUNTER — Ambulatory Visit: Payer: Self-pay | Admitting: Internal Medicine

## 2024-05-28 DIAGNOSIS — G4730 Sleep apnea, unspecified: Secondary | ICD-10-CM

## 2024-05-28 DIAGNOSIS — E785 Hyperlipidemia, unspecified: Secondary | ICD-10-CM

## 2024-05-28 LAB — HEMOGLOBIN A1C
Est. average glucose Bld gHb Est-mCnc: 105 mg/dL
Hgb A1c MFr Bld: 5.3 % (ref 4.8–5.6)

## 2024-06-09 ENCOUNTER — Encounter (HOSPITAL_BASED_OUTPATIENT_CLINIC_OR_DEPARTMENT_OTHER): Admitting: Cardiology

## 2024-06-09 DIAGNOSIS — G4733 Obstructive sleep apnea (adult) (pediatric): Secondary | ICD-10-CM

## 2024-06-10 ENCOUNTER — Ambulatory Visit: Attending: Internal Medicine

## 2024-06-10 DIAGNOSIS — R0683 Snoring: Secondary | ICD-10-CM

## 2024-06-10 NOTE — Procedures (Signed)
   SLEEP STUDY REPORT Patient Information Study Date: 06/09/2024 Patient Name: Ian [name] Patient ID: [id number] Birth Date: 02-Aug-1965 Age: 58 Gender: Male BMI: 28.4 (W=203 lb, H=5' 11'') Referring Physician: [name], MD  TEST DESCRIPTION: Home sleep apnea testing was completed using the WatchPat, a Type 1 device, utilizing  peripheral arterial tonometry (PAT), chest movement, actigraphy, pulse oximetry, pulse rate, body position and snore.  AHI was calculated with apnea and hypopnea using valid sleep time as the denominator. RDI includes apneas,  hypopneas, and RERAs. The data acquired and the scoring of sleep and all associated events were performed in  accordance with the recommended standards and specifications as outlined in the AASM Manual for the Scoring of  Sleep and Associated Events 2.2.0 (2015).   FINDINGS:   1. Mild Obstructive Sleep Apnea with AHI 5.2/hr occurring only during supine position sleep   2. Central Sleep Apnea with pAHIc 0.1/hr.   3. Oxygen desaturations as low as 86%.   4. Moderate snoring was present. O2 sats were < 88% for 0.3 min.   5. Total sleep time was 7 hrs and 21 min.   6. 18.2% of total sleep time was spent in REM sleep.   7. Normal sleep onset latency at 23 min.   8. Shortened REM sleep onset latency at 60 min.   9. Total awakenings were 10.  10. Arrhythmia detection: None  DIAGNOSIS: Mild Obstructive Sleep Apnea (G47.33). All events occurred in the supine position.  RECOMMENDATIONS: 1. Clinical correlation of these findings is necessary. The decision to treat obstructive sleep apnea (OSA) is usually  based on the presence of apnea symptoms or the presence of associated medical conditions such as Hypertension,  Congestive Heart Failure, Atrial Fibrillation or Obesity. The most common symptoms of OSA are snoring, gasping for  breath while sleeping, daytime sleepiness and fatigue.  2. Initiating apnea therapy is recommended given the  presence of symptoms and/or associated conditions.  Recommend proceeding with one of the following:  a. Auto-CPAP therapy with a pressure range of 5-20cm H2O.  b. An oral appliance (OA) that can be obtained from certain dentists with expertise in sleep medicine. These are  primarily of use in non-obese patients with mild and moderate disease.  c. An ENT consultation which may be useful to look for specific causes of obstruction and possible treatment  options.  d. If patient is intolerant to PAP therapy, consider referral to ENT for evaluation for hypoglossal nerve stimulator.  3. Close follow-up is necessary to ensure success with CPAP or oral appliance therapy for maximum benefit . 4. A follow-up oximetry study on CPAP is recommended to assess the adequacy of therapy and determine the need  for supplemental oxygen or the potential need for Bi-level therapy. An arterial blood gas to determine the adequacy of  baseline ventilation and oxygenation should also be considered. 5. Healthy sleep recommendations include: adequate nightly sleep (normal 7-9 hrs/night), avoidance of caffeine after  noon and alcohol near bedtime, and maintaining a sleep environment that is cool, dark and quiet. 6. Weight loss for overweight patients is recommended. Even modest amounts of weight loss can significantly  improve the severity of sleep apnea. 7. Snoring recommendations include: weight loss where appropriate, side sleeping, and avoidance of alcohol before  bed. 8. Operation of motor vehicle should be avoided when sleepy.  Signature: Wilbert Bihari, MD; [facility]; Diplomat, American Board of Sleep  Medicine Electronically Signed: 06/10/2024 1:30:30 P

## 2024-06-11 ENCOUNTER — Telehealth: Payer: Self-pay

## 2024-06-11 NOTE — Telephone Encounter (Signed)
 Left detailed message (OK per DPR) with message from Dr. Okey and Dr. Shlomo (message below).  Provided office number for callback if any questions.

## 2024-06-11 NOTE — Telephone Encounter (Signed)
-----   Message from [place]er sent at 06/10/2024  4:09 PM EST ----- Please make sure that pt is aware of only minimal OSA when in supine position (back) only   Would try to not sleep on back ----- Message ----- From: Shlomo Wilbert SAUNDERS, MD Sent: 06/10/2024   1:32 PM EST To: Vina Okey GAILS, MD  Very minimal OSA occurring only during supine sleep.  No treatment recommended at this time except to avoid sleeping supine

## 2024-06-23 ENCOUNTER — Telehealth: Payer: Self-pay | Admitting: *Deleted

## 2024-06-23 NOTE — Telephone Encounter (Signed)
-----   Message from Wilbert Bihari sent at 06/10/2024  1:32 PM EST ----- Very minimal OSA occurring only during supine sleep.  No treatment recommended at this time except to avoid sleeping supine

## 2024-06-23 NOTE — Telephone Encounter (Signed)
 The patient has been notified of the result via his mychart.

## 2024-07-14 ENCOUNTER — Encounter: Payer: Self-pay | Admitting: *Deleted

## 2024-08-18 NOTE — Addendum Note (Signed)
 Addended by: VICCI ROXIE CROME on: 08/18/2024 01:12 PM   Modules accepted: Orders
